# Patient Record
Sex: Male | Born: 1955 | Race: White | Hispanic: No | Marital: Married | Smoking: Former smoker
Health system: Southern US, Community
[De-identification: ages and names within clinical notes are randomized; demographics above are authoritative.]

## PROBLEM LIST (undated history)

## (undated) DIAGNOSIS — K2970 Gastritis, unspecified, without bleeding: Secondary | ICD-10-CM

## (undated) DIAGNOSIS — G629 Polyneuropathy, unspecified: Secondary | ICD-10-CM

## (undated) DIAGNOSIS — I10 Essential (primary) hypertension: Secondary | ICD-10-CM

## (undated) DIAGNOSIS — E669 Obesity, unspecified: Secondary | ICD-10-CM

## (undated) DIAGNOSIS — K449 Diaphragmatic hernia without obstruction or gangrene: Secondary | ICD-10-CM

## (undated) DIAGNOSIS — K219 Gastro-esophageal reflux disease without esophagitis: Secondary | ICD-10-CM

## (undated) DIAGNOSIS — K222 Esophageal obstruction: Secondary | ICD-10-CM

## (undated) DIAGNOSIS — E119 Type 2 diabetes mellitus without complications: Secondary | ICD-10-CM

## (undated) HISTORY — PX: OTHER SURGICAL HISTORY: SHX169

## (undated) HISTORY — PX: INGUINAL HERNIA REPAIR: SUR1180

---

## 2010-09-13 ENCOUNTER — Emergency Department (HOSPITAL_COMMUNITY)
Admission: EM | Admit: 2010-09-13 | Discharge: 2010-09-14 | Payer: Self-pay | Source: Home / Self Care | Admitting: Emergency Medicine

## 2010-11-29 LAB — BASIC METABOLIC PANEL
BUN: 15 mg/dL (ref 6–23)
Calcium: 9.3 mg/dL (ref 8.4–10.5)
Creatinine, Ser: 0.91 mg/dL (ref 0.4–1.5)
GFR calc non Af Amer: >60 mL/min (ref >60)
Glucose, Bld: 124 mg/dL — ABNORMAL HIGH (ref 70–99)
Potassium: 3.8 mEq/L (ref 3.5–5.1)

## 2010-11-29 LAB — URINALYSIS, ROUTINE W REFLEX MICROSCOPIC
Bilirubin Urine: NEGATIVE
Ketones, ur: 15 mg/dL — AB
Nitrite: NEGATIVE
pH: 5.5 (ref 5.0–8.0)

## 2021-08-05 ENCOUNTER — Other Ambulatory Visit (HOSPITAL_COMMUNITY): Payer: Self-pay | Admitting: Family Medicine

## 2021-08-05 ENCOUNTER — Other Ambulatory Visit: Payer: Self-pay | Admitting: Family Medicine

## 2021-08-05 DIAGNOSIS — M47816 Spondylosis without myelopathy or radiculopathy, lumbar region: Secondary | ICD-10-CM | POA: Insufficient documentation

## 2021-08-05 DIAGNOSIS — G629 Polyneuropathy, unspecified: Secondary | ICD-10-CM | POA: Insufficient documentation

## 2021-08-05 DIAGNOSIS — E66812 Obesity, class 2: Secondary | ICD-10-CM | POA: Insufficient documentation

## 2021-08-05 DIAGNOSIS — E291 Testicular hypofunction: Secondary | ICD-10-CM | POA: Insufficient documentation

## 2021-08-05 DIAGNOSIS — F17201 Nicotine dependence, unspecified, in remission: Secondary | ICD-10-CM | POA: Insufficient documentation

## 2021-08-10 ENCOUNTER — Ambulatory Visit (HOSPITAL_COMMUNITY)
Admission: RE | Admit: 2021-08-10 | Discharge: 2021-08-10 | Disposition: A | Discharge status: Home / Self Care | Payer: BC Managed Care – PPO | Source: Ambulatory Visit | Attending: Family Medicine | Admitting: Family Medicine

## 2021-08-10 ENCOUNTER — Other Ambulatory Visit (HOSPITAL_COMMUNITY): Payer: Self-pay | Admitting: Family Medicine

## 2021-08-10 ENCOUNTER — Other Ambulatory Visit: Payer: Self-pay

## 2021-08-10 DIAGNOSIS — M542 Cervicalgia: Secondary | ICD-10-CM | POA: Diagnosis present

## 2021-08-19 DIAGNOSIS — N529 Male erectile dysfunction, unspecified: Secondary | ICD-10-CM | POA: Insufficient documentation

## 2021-09-10 ENCOUNTER — Ambulatory Visit: Payer: BC Managed Care – PPO

## 2021-09-10 ENCOUNTER — Other Ambulatory Visit: Payer: Self-pay

## 2021-09-10 ENCOUNTER — Ambulatory Visit: Payer: BC Managed Care – PPO | Admitting: Orthopedic Surgery

## 2021-09-10 ENCOUNTER — Encounter: Payer: Self-pay | Admitting: Orthopedic Surgery

## 2021-09-10 VITALS — Ht 72.0 in | Wt 280.0 lb

## 2021-09-10 DIAGNOSIS — M25562 Pain in left knee: Secondary | ICD-10-CM

## 2021-09-10 DIAGNOSIS — M1712 Unilateral primary osteoarthritis, left knee: Secondary | ICD-10-CM

## 2021-09-10 DIAGNOSIS — M170 Bilateral primary osteoarthritis of knee: Secondary | ICD-10-CM | POA: Diagnosis not present

## 2021-09-10 DIAGNOSIS — M25561 Pain in right knee: Secondary | ICD-10-CM

## 2021-09-10 DIAGNOSIS — M1711 Unilateral primary osteoarthritis, right knee: Secondary | ICD-10-CM | POA: Diagnosis not present

## 2021-09-10 NOTE — Patient Instructions (Signed)

## 2021-09-10 NOTE — Progress Notes (Signed)
New Patient Visit  Assessment: HERMES WAFER is a 65 y.o. male with the following: 1. Arthritis of left knee; moderate to severe 2. Arthritis of right knee; mild to moderate  Plan: Pain in both knees.  Left is worse than right.  Radiographs of the left knee demonstrates severe arthritis overall, with mild osteophytosis, and complete loss of joint space within the medial compartment.  Radiographs of the right knee appear slightly better.  Near complete loss of joint space within the medial compartment.  Small osteophytes also present.  Discussed multiple treatment options, and he would like to proceed with bilateral steroid injections, as he has had a good response in the past.  Continue with medications as needed.  Stay as active as possible.  Follow-up as needed.  Procedure note injection Left knee joint   Verbal consent was obtained to inject the left knee joint  Timeout was completed to confirm the site of injection.  The skin was prepped with alcohol and ethyl chloride was sprayed at the injection site.  A 21-gauge needle was used to inject 40 mg of Depo-Medrol and 1% lidocaine (3 cc) into the left knee using an anterolateral approach.  There were no complications. A sterile bandage was applied.     Procedure note injection Right knee joint   Verbal consent was obtained to inject the right knee joint  Timeout was completed to confirm the site of injection.  The skin was prepped with alcohol and ethyl chloride was sprayed at the injection site.  A 21-gauge needle was used to inject 40 mg of Depo-Medrol and 1% lidocaine (3 cc) into the left knee using an anterolateral approach.  There were no complications. A sterile bandage was applied.    Follow-up: Return if symptoms worsen or fail to improve.  Subjective:  Chief Complaint  Patient presents with   Knee Pain    Lt > Rt for on and off for over 15 yrs.     History of Present Illness: VASILI Solis is a 65 y.o. male who has  been referred to clinic today by Gypsy Decant, DC for evaluation of bilateral knee pain.  Patient has had pain, primarily in the left knee for the past 10-15 years.  He states he had an injection at least 10 years ago, which provided excellent relief.  He is also had progressively worsening pain in the right knee.  Pain is primarily within the medial compartment.  He also has some pain behind the knee.  He notices some grinding sensations in bilateral knees.  He is not currently taking any medications.  No physical therapy.   Review of Systems: No fevers or chills No numbness or tingling No chest pain No shortness of breath No bowel or bladder dysfunction No GI distress No headaches   Medical History:  History reviewed. No pertinent past medical history.  History reviewed. No pertinent surgical history.  History reviewed. No pertinent family history. Social History   Tobacco Use   Smoking status: Former    Types: Cigarettes   Smokeless tobacco: Never    No Known Allergies  No outpatient medications have been marked as taking for the 09/10/21 encounter (Office Visit) with Mordecai Rasmussen, MD.    Objective: Ht 6' (1.829 m)    Wt 280 lb (127 kg)    BMI 37.97 kg/m   Physical Exam:  General: Alert and oriented. Gait: Slow, steady gait.  Evaluation left knee demonstrates a mild varus alignment overall.  No  effusion is appreciated.  Tenderness to palpation along the medial joint line.  No increased laxity varus valgus stress.  Range of motion is from 3-120 degrees.  Negative Lachman.  Evaluation of the right knee demonstrates overall neutral alignment.  No effusion.  Mild tenderness to palpation along medial joint line.  No increased laxity to varus or valgus stress.  Range of motion is from 3-120 degrees.  Negative Lachman.  Crepitus in bilateral knees with range of motion testing.  IMAGING: I personally ordered and reviewed the following images  X-rays of bilateral  knees were obtained in clinic today.  Varus alignment to the left knee, with mild varus alignment of the right knee.  Complete loss of joint space within the medial compartment of the left knee.  Near complete loss of the joint space within the medial compartment of the right knee.  Osteophytes are present in all 3 compartments in bilateral knees.  Degenerative changes are noted within the patellofemoral compartment, including osteophytes, and loss of joint space.  No acute injuries are noted.  Impression: Moderate to severe left knee arthritis, mild to moderate right knee arthritis.   New Medications:  No orders of the defined types were placed in this encounter.     Mordecai Rasmussen, MD  09/10/2021 11:17 PM

## 2021-09-15 ENCOUNTER — Ambulatory Visit (HOSPITAL_COMMUNITY): Payer: Self-pay

## 2021-09-15 ENCOUNTER — Encounter (HOSPITAL_COMMUNITY): Payer: Self-pay

## 2021-11-03 ENCOUNTER — Other Ambulatory Visit: Payer: Self-pay

## 2021-11-03 ENCOUNTER — Encounter: Payer: Self-pay | Admitting: Orthopedic Surgery

## 2021-11-03 ENCOUNTER — Ambulatory Visit: Payer: BC Managed Care – PPO | Admitting: Orthopedic Surgery

## 2021-11-03 DIAGNOSIS — M1712 Unilateral primary osteoarthritis, left knee: Secondary | ICD-10-CM

## 2021-11-03 MED ORDER — PREDNISONE 10 MG (21) PO TBPK: ORAL_TABLET | ORAL | 0 refills | Status: DC

## 2021-11-03 NOTE — Progress Notes (Signed)
Return patient Visit  Assessment: Keith Solis is a 66 y.o. male with the following: 1. Arthritis of left knee; moderate to severe 2. Arthritis of right knee; mild to moderate  Plan: Acute worsening of left knee pain, associated with arthritis.  This is brought on by twisting injury.  It is too early to consider repeat injections.  He also expressed interest in hyaluronic acid injections.  We could consider injections in 6 weeks if he still having difficulty.  I provided him with a course of prednisone.  Continue with activities as tolerated.  Call with issues.  See him in 6 weeks.   Follow-up: Return in about 6 weeks (around 12/15/2021).  Subjective:  Chief Complaint  Patient presents with   Knee Pain    LT/ DOI 11/02/21 Pt stepped into a hole and twisted it. Painful on medial side and unable to completely bend knee     History of Present Illness: Keith Solis is a 66 y.o. male who returns to clinic today for repeat evaluation of left knee pain.  He had bilateral knee injections for arthritis approximately 6 weeks ago.  He had a good response, until recently.  Yesterday, he stepped in a hole, and twisted his knee.  Since then has had significant pain.  He had difficulty with ambulation.  Mild swelling in his left knee.     Review of Systems: No fevers or chills No numbness or tingling No chest pain No shortness of breath No bowel or bladder dysfunction No GI distress No headaches  Objective: There were no vitals taken for this visit.  Physical Exam:  General: Alert and oriented. Gait: Slow, steady gait.  Evaluation left knee demonstrates a mild varus alignment overall.  No effusion is appreciated.  Tenderness to palpation along the medial joint line.  No increased laxity varus valgus stress.  Range of motion is from 3-120 degrees.  Negative Lachman.  IMAGING: I personally ordered and reviewed the following images  No new imaging obtained today.   New  Medications:  Meds ordered this encounter  Medications   predniSONE (STERAPRED UNI-PAK 21 TAB) 10 MG (21) TBPK tablet    Sig: 10 mg DS 12 as directed    Dispense:  48 tablet    Refill:  0      Mordecai Rasmussen, MD  11/03/2021 10:40 PM

## 2021-11-03 NOTE — Patient Instructions (Signed)
If interested in the gel injections, we will have to verify insurance coverage prior to the next appointment.

## 2021-11-09 ENCOUNTER — Telehealth: Payer: Self-pay | Admitting: Radiology

## 2021-11-09 NOTE — Telephone Encounter (Signed)
On 11/05/21 patient brought a note by the office, handwritten, with a phone number to call to authorize "rooster comb" [phone].  Placed in my inbox on 11/08/21.  Patient has seen Dr Amedeo Kinsman x 2 and now is scheduled with Dr Aline Brochure I believe in error.  I will move to Dr Amedeo Kinsman' schedule.    *Dr Amedeo Kinsman- is it ok to proceed with auth of gel injections and do those on March 30th?

## 2021-12-08 ENCOUNTER — Ambulatory Visit: Payer: BC Managed Care – PPO | Admitting: Orthopedic Surgery

## 2021-12-15 ENCOUNTER — Ambulatory Visit: Payer: BC Managed Care – PPO | Admitting: Orthopedic Surgery

## 2021-12-16 ENCOUNTER — Ambulatory Visit: Payer: BC Managed Care – PPO | Admitting: Orthopedic Surgery

## 2021-12-22 ENCOUNTER — Ambulatory Visit: Payer: BC Managed Care – PPO | Admitting: Orthopedic Surgery

## 2021-12-22 DIAGNOSIS — G4733 Obstructive sleep apnea (adult) (pediatric): Secondary | ICD-10-CM | POA: Insufficient documentation

## 2021-12-22 DIAGNOSIS — J309 Allergic rhinitis, unspecified: Secondary | ICD-10-CM | POA: Insufficient documentation

## 2022-01-12 HISTORY — PX: REPLACEMENT TOTAL KNEE: SUR1224

## 2022-01-17 ENCOUNTER — Other Ambulatory Visit (HOSPITAL_COMMUNITY): Payer: Self-pay | Admitting: Orthopedic Surgery

## 2022-01-17 ENCOUNTER — Ambulatory Visit (HOSPITAL_COMMUNITY)
Admission: RE | Admit: 2022-01-17 | Discharge: 2022-01-17 | Disposition: A | Discharge status: Home / Self Care | Payer: BC Managed Care – PPO | Source: Ambulatory Visit | Attending: Orthopedic Surgery | Admitting: Orthopedic Surgery

## 2022-01-17 DIAGNOSIS — M79605 Pain in left leg: Secondary | ICD-10-CM | POA: Diagnosis not present

## 2022-01-17 DIAGNOSIS — M7989 Other specified soft tissue disorders: Secondary | ICD-10-CM | POA: Insufficient documentation

## 2022-03-15 DIAGNOSIS — R5383 Other fatigue: Secondary | ICD-10-CM | POA: Insufficient documentation

## 2022-03-16 DIAGNOSIS — R42 Dizziness and giddiness: Secondary | ICD-10-CM | POA: Insufficient documentation

## 2022-03-29 ENCOUNTER — Telehealth: Payer: Self-pay | Admitting: Orthopedic Surgery

## 2022-03-29 NOTE — Telephone Encounter (Signed)
noted 

## 2022-03-29 NOTE — Telephone Encounter (Signed)
Keith Solis came in today to let us know his insurance has changed now, he has [employer].  He had [employer] before and he came to [place] one time and found out he was NOT in network with [place] and then went to [place] because we were not in network with his insurance and now since he has changed insurance he wants to come back to [place] since he is now in network with [place].  He said he did receive 1 injection from Dr. French Ana.   I did advise him we will need all the office notes from them and any XRAY, CT or MRI, because since he went to another physician it is considered a 2nd opinion.   He will get them to fax [place] all the information.

## 2022-04-07 ENCOUNTER — Encounter: Payer: Self-pay | Admitting: *Deleted

## 2022-04-15 ENCOUNTER — Emergency Department (HOSPITAL_COMMUNITY): Payer: Medicare PPO

## 2022-04-15 ENCOUNTER — Encounter (HOSPITAL_COMMUNITY): Payer: Self-pay

## 2022-04-15 ENCOUNTER — Other Ambulatory Visit: Payer: Self-pay

## 2022-04-15 ENCOUNTER — Observation Stay (HOSPITAL_BASED_OUTPATIENT_CLINIC_OR_DEPARTMENT_OTHER): Payer: Medicare PPO

## 2022-04-15 ENCOUNTER — Observation Stay (HOSPITAL_COMMUNITY)
Admission: EM | Admit: 2022-04-15 | Discharge: 2022-04-15 | Disposition: A | Discharge status: Home / Self Care | Payer: Medicare PPO | Attending: Internal Medicine | Admitting: Internal Medicine

## 2022-04-15 DIAGNOSIS — R0609 Other forms of dyspnea: Secondary | ICD-10-CM | POA: Diagnosis present

## 2022-04-15 DIAGNOSIS — Z79899 Other long term (current) drug therapy: Secondary | ICD-10-CM | POA: Diagnosis not present

## 2022-04-15 DIAGNOSIS — Z87891 Personal history of nicotine dependence: Secondary | ICD-10-CM | POA: Diagnosis not present

## 2022-04-15 DIAGNOSIS — Z96652 Presence of left artificial knee joint: Secondary | ICD-10-CM | POA: Diagnosis not present

## 2022-04-15 DIAGNOSIS — N179 Acute kidney failure, unspecified: Secondary | ICD-10-CM | POA: Insufficient documentation

## 2022-04-15 DIAGNOSIS — J45909 Unspecified asthma, uncomplicated: Secondary | ICD-10-CM | POA: Diagnosis not present

## 2022-04-15 DIAGNOSIS — I959 Hypotension, unspecified: Secondary | ICD-10-CM | POA: Diagnosis present

## 2022-04-15 DIAGNOSIS — E039 Hypothyroidism, unspecified: Secondary | ICD-10-CM | POA: Insufficient documentation

## 2022-04-15 DIAGNOSIS — R55 Syncope and collapse: Secondary | ICD-10-CM | POA: Diagnosis not present

## 2022-04-15 DIAGNOSIS — R0600 Dyspnea, unspecified: Secondary | ICD-10-CM

## 2022-04-15 DIAGNOSIS — I493 Ventricular premature depolarization: Secondary | ICD-10-CM

## 2022-04-15 DIAGNOSIS — N289 Disorder of kidney and ureter, unspecified: Secondary | ICD-10-CM | POA: Insufficient documentation

## 2022-04-15 DIAGNOSIS — I10 Essential (primary) hypertension: Secondary | ICD-10-CM | POA: Insufficient documentation

## 2022-04-15 DIAGNOSIS — R0689 Other abnormalities of breathing: Secondary | ICD-10-CM

## 2022-04-15 DIAGNOSIS — R079 Chest pain, unspecified: Secondary | ICD-10-CM | POA: Diagnosis not present

## 2022-04-15 HISTORY — DX: Essential (primary) hypertension: I10

## 2022-04-15 HISTORY — DX: Polyneuropathy, unspecified: G62.9

## 2022-04-15 LAB — CBC WITH DIFFERENTIAL/PLATELET
Abs Immature Granulocytes: 0.09 10*3/uL — ABNORMAL HIGH (ref 0.00–0.07)
Basophils Absolute: 0.1 10*3/uL (ref 0.0–0.1)
Basophils Relative: 1 %
Eosinophils Absolute: 0.3 10*3/uL (ref 0.0–0.5)
Eosinophils Relative: 2 %
HCT: 50.6 % (ref 39.0–52.0)
Hemoglobin: 16.6 g/dL (ref 13.0–17.0)
Immature Granulocytes: 1 %
Lymphocytes Relative: 14 %
Lymphs Abs: 1.8 10*3/uL (ref 0.7–4.0)
MCH: 29.6 pg (ref 26.0–34.0)
MCHC: 32.8 g/dL (ref 30.0–36.0)
MCV: 90.4 fL (ref 80.0–100.0)
Monocytes Absolute: 0.8 10*3/uL (ref 0.1–1.0)
Monocytes Relative: 6 %
Neutro Abs: 10 10*3/uL — ABNORMAL HIGH (ref 1.7–7.7)
Neutrophils Relative %: 76 %
Platelets: 257 10*3/uL (ref 150–400)
RBC: 5.6 MIL/uL (ref 4.22–5.81)
RDW: 13.2 % (ref 11.5–15.5)
WBC: 13 10*3/uL — ABNORMAL HIGH (ref 4.0–10.5)
nRBC: 0 % (ref 0.0–0.2)

## 2022-04-15 LAB — URINALYSIS, ROUTINE W REFLEX MICROSCOPIC
Bilirubin Urine: NEGATIVE
Glucose, UA: NEGATIVE mg/dL
Hgb urine dipstick: NEGATIVE
Ketones, ur: NEGATIVE mg/dL
Leukocytes,Ua: NEGATIVE
Nitrite: NEGATIVE
Protein, ur: 30 mg/dL — AB
Specific Gravity, Urine: 1.016 (ref 1.005–1.030)
pH: 5 (ref 5.0–8.0)

## 2022-04-15 LAB — COMPREHENSIVE METABOLIC PANEL
ALT: 17 U/L (ref 0–44)
AST: 16 U/L (ref 15–41)
Albumin: 4.1 g/dL (ref 3.5–5.0)
Alkaline Phosphatase: 54 U/L (ref 38–126)
Anion gap: 7 (ref 5–15)
BUN: 14 mg/dL (ref 8–23)
CO2: 29 mmol/L (ref 22–32)
Calcium: 9.3 mg/dL (ref 8.9–10.3)
Chloride: 105 mmol/L (ref 98–111)
Creatinine, Ser: 1.5 mg/dL — ABNORMAL HIGH (ref 0.61–1.24)
GFR, Estimated: 51 mL/min — ABNORMAL LOW (ref >60)
Glucose, Bld: 123 mg/dL — ABNORMAL HIGH (ref 70–99)
Potassium: 3.5 mmol/L (ref 3.5–5.1)
Sodium: 141 mmol/L (ref 135–145)
Total Bilirubin: 1.1 mg/dL (ref 0.3–1.2)
Total Protein: 7.1 g/dL (ref 6.5–8.1)

## 2022-04-15 LAB — PROTIME-INR
INR: 0.9 (ref 0.8–1.2)
Prothrombin Time: 12.5 seconds (ref 11.4–15.2)

## 2022-04-15 LAB — ECHOCARDIOGRAM COMPLETE
Area-P 1/2: 3.03 cm2
Height: 72 in
S' Lateral: 3.8 cm
Weight: 4160 oz

## 2022-04-15 LAB — TROPONIN I (HIGH SENSITIVITY): Troponin I (High Sensitivity): 11 ng/L (ref <18)

## 2022-04-15 MED ORDER — ASPIRIN 81 MG PO CHEW
324.0000 mg | CHEWABLE_TABLET | Freq: Once | ORAL | Status: AC
  Administered 2022-04-15: 324 mg via ORAL
  Filled 2022-04-15: qty 4

## 2022-04-15 MED ORDER — SODIUM CHLORIDE 0.9 % IV BOLUS
1000.0000 mL | Freq: Once | INTRAVENOUS | Status: AC
  Administered 2022-04-15: 1000 mL via INTRAVENOUS

## 2022-04-15 NOTE — Consult Note (Signed)
Cardiology Consult Note:   Patient ID: Keith Solis MRN: [id number]; DOB: July 15, 1956   Admission date: 04/15/2022  PCP:  Celene Squibb, MD   [facility] Providers Cardiologist: [facility]   Chief Complaint:  near syncope  Patient Profile:   Keith Solis is a 66 y.o. male with hypothyroidism, morbid obesity and HTN, peripheral neuropathy NOS, former tobacco abuse, asthma , erectile dysfunction. who is being seen 04/15/2022 for the evaluation of near syncope.  History of Present Illness:   Keith Solis notes that he is feeling fine now- he wants a cheeseburger.    Was last feeling well 04/14/22. Baseline activity includes being able to do yard work without issue. 04/14/22 was doing yard work, used a Risk manager to clear some shrubs and pressure washed most of his house. Took at [place] for erectile dysfunction with wife.   AM went to finish the rest of his house.  After climbing down the house he had shortness of breath.  Going to get his inhaler had near syncope.  Wife check his BP with amb BP cough: hypotensive to 70s.  Patient denies issues with chest pain; notes his neck pain was because of his neck arthritis and looking up to do the pressure washing.  Has had no chest pain, chest pressure, chest tightness, chest stinging .   No shortness of breath, DOE .  No PND or orthopnea.  No bendopnea, weight gain, leg swelling , or abdominal swelling.  No syncope or near syncope presently .   Notes  no palpitations or funny heart beats event when he has PVCs on telemetry.  Was surprised to hear his creatinine was elevated  Inpatient evaluation because of chest pain.  Patient reports no prior cardiac testing.  In the ED patient received medications 1 L and full dose ASA with improvement in BP as symptoms.  Key labs notable for Creatinine 1.5 (former 0.91 back in 2011).  Normal troponin EKG: SR rate 87 WNL Tele: one 5 beat run of NSVT, frequent PVCs Cardiology called  for evaluation.    Past Medical History:  Diagnosis Date   Hypertension    Neuropathy     History reviewed. No pertinent surgical history.   Medications Prior to Admission: Prior to Admission medications   Medication Sig Start Date End Date Taking? Authorizing Provider  amLODipine (NORVASC) 5 MG tablet Take 5 mg by mouth daily.   Yes [provider]  gabapentin (NEURONTIN) 300 MG capsule Take 300 mg by mouth 3 (three) times daily.   Yes [provider]  levothyroxine (SYNTHROID) 75 MCG tablet Take 75 mcg by mouth daily before breakfast.   Yes [provider]  lisinopril (ZESTRIL) 40 MG tablet Take 40 mg by mouth daily.   Yes [provider]  testosterone cypionate (DEPOTESTOSTERONE CYPIONATE) 200 MG/ML injection SMARTSIG:Milliliter(s) IM 09/02/21  Yes [provider]  predniSONE (STERAPRED UNI-PAK 21 TAB) 10 MG (21) TBPK tablet 10 mg DS 12 as directed Patient not taking: Reported on 04/15/2022 11/03/21   Mordecai Rasmussen, MD     Allergies:   No Known Allergies  Social History:   Social History   Socioeconomic History   Marital status: Married    Spouse name: Not on file   Number of children: Not on file   Years of education: Not on file   Highest education level: Not on file  Occupational History   Not on file  Tobacco Use   Smoking status: Former  Types: Cigarettes   Smokeless tobacco: Never  Substance and Sexual Activity   Alcohol use: Not Currently   Drug use: Never   Sexual activity: Yes  Other Topics Concern   Not on file  Social History Narrative   Not on file   Social Determinants of Health   Financial Resource Strain: Not on file  Food Insecurity: Not on file  Transportation Needs: Not on file  Physical Activity: Not on file  Stress: Not on file  Social Connections: Not on file  Intimate Partner Violence: Not on file    Family History:   Siste and unclear have pulmonary sarcoidosis Father had 3VD and CABG  at age 72, he smoked all his life.  ROS:  Please see the history of present illness.  All other ROS reviewed and negative.     Physical Exam/Data:   Vitals:   04/15/22 1112 04/15/22 1130 04/15/22 1200 04/15/22 1300  BP: 126/76 124/78 130/70 138/83  Pulse: 66 70 62 73  Resp: '14 15 14 13  '$ Temp:      TempSrc:      SpO2: 99% 100% 97% 98%  Weight:      Height:       No intake or output data in the 24 hours ending 04/15/22 1319    04/15/2022    9:32 AM 09/10/2021    8:54 AM  Last 3 Weights  Weight (lbs) 260 lb 280 lb  Weight (kg) 117.935 kg 127.007 kg     Body mass index is 35.26 kg/m.  General:  morbid obesity in no acute distress HEENT: normal Neck: no JVD Vascular: No carotid bruits; Distal pulses 2+ bilaterally   Cardiac:  normal S1, S2; RRR lareglyy ; no murmur  Lungs:  clear to auscultation bilaterally, no wheezing, rhonchi or rales  Abd: soft, nontender, no hepatomegaly  Ext: no edema Musculoskeletal:  No deformities, BUE and BLE strength normal and equal Skin: warm and dry  Neuro:  CNs 2-12 intact, no focal abnormalities noted Psych:  Normal affect    Laboratory Data:  High Sensitivity Troponin:   Recent Labs  Lab 04/15/22 0941  TROPONINIHS 11      Chemistry Recent Labs  Lab 04/15/22 0941  NA 141  K 3.5  CL 105  CO2 29  GLUCOSE 123*  BUN 14  CREATININE 1.50*  CALCIUM 9.3  GFRNONAA 51*  ANIONGAP 7    Recent Labs  Lab 04/15/22 0941  PROT 7.1  ALBUMIN 4.1  AST 16  ALT 17  ALKPHOS 54  BILITOT 1.1   Lipids No results for input(s): "CHOL", "TRIG", "HDL", "LABVLDL", "LDLCALC", "CHOLHDL" in the last 168 hours. Hematology Recent Labs  Lab 04/15/22 0941  WBC 13.0*  RBC 5.60  HGB 16.6  HCT 50.6  MCV 90.4  MCH 29.6  MCHC 32.8  RDW 13.2  PLT 257   Thyroid No results for input(s): "TSH", "FREET4" in the last 168 hours. BNPNo results for input(s): "BNP", "PROBNP" in the last 168 hours.  DDimer No results for input(s): "DDIMER" in the  last 168 hours.   Radiology/Studies:  DG Chest Port 1 View  Result Date: 04/15/2022 CLINICAL DATA:  Chest pain.  Chest pain and shortness of breath. EXAM: PORTABLE CHEST 1 VIEW COMPARISON:  No pertinent prior exams available for comparison. FINDINGS: Heart size within normal limits. No appreciable airspace consolidation. No evidence of pleural effusion or pneumothorax. No acute bony abnormality identified. Degenerative changes of the spine. IMPRESSION: No evidence of active cardiopulmonary  disease. Electronically Signed   By: Kellie Simmering D.O.   On: 04/15/2022 09:50     Assessment and Plan:   Near Syncope With frequent PVCs and one episode of NSVT With HTN With FHX of CAD and sarcoidosis With former tobacco use With new AKI - this could all be from new OTC supplement; we discussed stopping this and that outpatient urology eval for ED would be appropriate - would recommend echocardiogram and has worked to facilitate this - if normal would stop CCB, start metoprolol 12.5 mg PO BID, and outpatient ischemic testing with resolution of AKI - if abnormal would warrant further testing - patient and family have concerns about admission: if the are OBS they are fine, but based on their insurance if they are admitted will need to pay $150 per day - we will work to move as much of his work up to outpatient as safely possible - AKI per primary   For questions or updates, please contact [place] Please consult [email or web address] for contact info under     Signed, Werner Lean, MD  04/15/2022 1:19 PM

## 2022-04-15 NOTE — ED Notes (Signed)
Cards at bedside

## 2022-04-15 NOTE — ED Provider Notes (Addendum)
[facility] EMERGENCY DEPARTMENT Provider Note   CSN: [id number] Arrival date & time: 04/15/22  2440     History  Chief Complaint  Patient presents with   Hypotension    Keith Solis is a 66 y.o. male.  HPI  This patient is a 66 year old male with a history of hypertension on amlodipine and lisinopril, he also has hypothyroidism and peripheral neuropathy of unknown cause.  The patient presents today with a feeling of near syncope, he states that he was outside on a ladder working on pressure washing his house but had some progressive shortness of breath.  He does note over the last couple of days he has had a vague discomfort in his chest which he attributed to acid reflux though he states it is not burning and he is not sure what acid reflux feels like.  This morning he also has a discomfort in the back of his neck and his shoulders which he usually does not have.  He has not passed out, he felt like he was going to, he checked his blood pressure and it was in the 60s, it gradually increased to the 80s, he decided to come to the hospital because he was feeling very poorly.  On arrival the patient's blood pressure is 105/63, he still feels a vague shortness of breath, he denies any leg swelling.  He does report a prior history of a left knee arthroplasty, he did require an EKG prior to that for screening but is never had a stress test or known coronary disease.  He does report that he stopped smoking about 10 years ago, he does not have diabetes nor does he have high cholesterol.  Home Medications Prior to Admission medications   Medication Sig Start Date End Date Taking? Authorizing Provider  amLODipine (NORVASC) 5 MG tablet Take 5 mg by mouth daily.   Yes [provider]  gabapentin (NEURONTIN) 300 MG capsule Take 300 mg by mouth 3 (three) times daily.   Yes [provider]  levothyroxine (SYNTHROID) 75 MCG tablet Take 75 mcg by mouth daily before breakfast.   Yes  [provider]  lisinopril (ZESTRIL) 40 MG tablet Take 40 mg by mouth daily.   Yes [provider]  testosterone cypionate (DEPOTESTOSTERONE CYPIONATE) 200 MG/ML injection SMARTSIG:Milliliter(s) IM 09/02/21  Yes [provider]  predniSONE (STERAPRED UNI-PAK 21 TAB) 10 MG (21) TBPK tablet 10 mg DS 12 as directed Patient not taking: Reported on 04/15/2022 11/03/21   Mordecai Rasmussen, MD      Allergies    Patient has no known allergies.    Review of Systems   Review of Systems  All other systems reviewed and are negative.   Physical Exam Updated Vital Signs BP (!) 142/64   Pulse 76   Temp 97.8 F (36.6 C) (Oral)   Resp 17   Ht 1.829 m (6')   Wt 117.9 kg   SpO2 96%   BMI 35.26 kg/m  Physical Exam Vitals and nursing note reviewed.  Constitutional:      General: He is not in acute distress.    Appearance: He is well-developed.  HENT:     Head: Normocephalic and atraumatic.     Mouth/Throat:     Pharynx: No oropharyngeal exudate.  Eyes:     General: No scleral icterus.       Right eye: No discharge.        Left eye: No discharge.     Conjunctiva/sclera: Conjunctivae  normal.     Pupils: Pupils are equal, round, and reactive to light.  Neck:     Thyroid: No thyromegaly.     Vascular: No JVD.  Cardiovascular:     Rate and Rhythm: Normal rate and regular rhythm.     Heart sounds: Normal heart sounds. No murmur heard.    No friction rub. No gallop.  Pulmonary:     Effort: Pulmonary effort is normal. No respiratory distress.     Breath sounds: Normal breath sounds. No wheezing or rales.  Abdominal:     General: Bowel sounds are normal. There is no distension.     Palpations: Abdomen is soft. There is no mass.     Tenderness: There is no abdominal tenderness.  Musculoskeletal:        General: No tenderness. Normal range of motion.     Cervical back: Normal range of motion and neck supple.     Right lower leg: No edema.     Left lower leg: No  edema.  Lymphadenopathy:     Cervical: No cervical adenopathy.  Skin:    General: Skin is warm and dry.     Findings: No erythema or rash.  Neurological:     Mental Status: He is alert.     Coordination: Coordination normal.  Psychiatric:        Behavior: Behavior normal.     ED Results / Procedures / Treatments   Labs (all labs ordered are listed, but only abnormal results are displayed) Labs Reviewed  CBC WITH DIFFERENTIAL/PLATELET - Abnormal; Notable for the following components:      Result Value   WBC 13.0 (*)    Neutro Abs 10.0 (*)    Abs Immature Granulocytes 0.09 (*)    All other components within normal limits  COMPREHENSIVE METABOLIC PANEL - Abnormal; Notable for the following components:   Glucose, Bld 123 (*)    Creatinine, Ser 1.50 (*)    GFR, Estimated 51 (*)    All other components within normal limits  URINALYSIS, ROUTINE W REFLEX MICROSCOPIC - Abnormal; Notable for the following components:   APPearance HAZY (*)    Protein, ur 30 (*)    Bacteria, UA RARE (*)    All other components within normal limits  PROTIME-INR  TROPONIN I (HIGH SENSITIVITY)    EKG EKG Interpretation  Date/Time:  Friday April 15 2022 09:45:24 EDT Ventricular Rate:  87 PR Interval:  153 QRS Duration: 98 QT Interval:  340 QTC Calculation: 409 R Axis:   67 Text Interpretation: Sinus rhythm Normal ECG Confirmed by Noemi Chapel [phone]) on 04/15/2022 10:02:54 AM  Radiology DG Chest Port 1 View  Result Date: 04/15/2022 CLINICAL DATA:  Chest pain.  Chest pain and shortness of breath. EXAM: PORTABLE CHEST 1 VIEW COMPARISON:  No pertinent prior exams available for comparison. FINDINGS: Heart size within normal limits. No appreciable airspace consolidation. No evidence of pleural effusion or pneumothorax. No acute bony abnormality identified. Degenerative changes of the spine. IMPRESSION: No evidence of active cardiopulmonary disease. Electronically Signed   By: Kellie Simmering D.O.   On:  04/15/2022 09:50    Procedures Procedures    Medications Ordered in ED Medications  sodium chloride 0.9 % bolus 1,000 mL (0 mLs Intravenous Stopped 04/15/22 1230)  aspirin chewable tablet 324 mg (324 mg Oral Given 04/15/22 7408)    ED Course/ Medical Decision Making/ A&P  Medical Decision Making Amount and/or Complexity of Data Reviewed Labs: ordered. Radiology: ordered. ECG/medicine tests: ordered.  Risk OTC drugs. Decision regarding hospitalization.   This patient presents to the ED for concern of near syncope, hypotension and some chest discomfort, this involves an extensive number of treatment options, and is a complaint that carries with it a high risk of complications and morbidity.  The differential diagnosis includes acute coronary syndrome, dehydration, orthostasis.  Of note the patient has not had his morning blood pressure medication raising more suspicion of an alternative cause given that he was fairly hypotensive.  He did not have anything to eat but did have some coffee.   Co morbidities that complicate the patient evaluation  Hypertension Prior tobacco use Obesity   Additional history obtained:  Additional history obtained from spouse at the bedside as well as the electronic medical record External records from outside source obtained and reviewed including followed by orthopedics and family doctor in the office, no cardiology evaluation   Lab Tests:  I Ordered, and personally interpreted labs.  The pertinent results include: Renal insufficiency, CBC shows no significant anemia, there is a leukocytosis, troponin is negative   Imaging Studies ordered:  I ordered imaging studies including chest x-ray I independently visualized and interpreted imaging which showed no acute findings I agree with the radiologist interpretation   Cardiac Monitoring: / EKG:  The patient was maintained on a cardiac monitor.  I personally viewed  and interpreted the cardiac monitored which showed an underlying rhythm of: Normal sinus rhythm   Consultations Obtained:  I requested consultation with the hospitalist Dr. Manuella Ghazi,  and discussed lab and imaging findings as well as pertinent plan - they recommend: Admission to the hospital   Problem List / ED Course / Critical interventions / Medication management  Patient with what could be a coronary equivalent needs to come in for provocative testing and further evaluation I ordered medication including IV fluid for hypotension Reevaluation of the patient after these medicines showed that the patient improved I have reviewed the patients home medicines and have made adjustments as needed   Social Determinants of Health:  None   Test / Admission - Considered:  Needs to be admitted to the hospital   Patient was hesitant about getting an echocardiogram secondary to insurance reimbursement.  I spoke with Kennan by phone with a representative "Hewitt Blade", he states that this would be completely covered as long as he was in the emergency department.  The patient is now agreeable to proceed with this.  We will contact echocardiogram department   The patient is now agreeable as I have talked to his insurance company, he will get the echocardiogram.  Cardiology has seen the patient, they will weigh in after the echo is done.  The patient is willing to stay till the end of that time.  At the time of change of shift care signed out to Dr. Roderic Palau to follow-up recommendations and disposition accordingly       Final Clinical Impression(s) / ED Diagnoses Final diagnoses:  Near syncope  Renal insufficiency     Noemi Chapel, MD 04/15/22 1144    Noemi Chapel, MD 04/15/22 1531

## 2022-04-15 NOTE — Progress Notes (Signed)
*  PRELIMINARY RESULTS* Echocardiogram 2D Echocardiogram has been performed.  Keith Solis 04/15/2022, 4:46 PM

## 2022-04-15 NOTE — ED Notes (Signed)
Hospitalist at bedside 

## 2022-04-15 NOTE — ED Notes (Signed)
Pt d/c home per MD order. Discharge summary reviewed, pt verbalizes understanding. Ambulatory off unit with wife. No s/s of acute distress noted at discharge,

## 2022-04-15 NOTE — ED Notes (Signed)
Pt refusing ECHO at this time. Dr Sabra Heck notified.

## 2022-04-15 NOTE — ED Notes (Signed)
ECHO at bedside.

## 2022-04-15 NOTE — ED Triage Notes (Signed)
Patient states that he was working outside this am for two hours and developed SHOB and dizziness. States that he went inside and checked BP and SBP was in the 60's. Gradually increased before coming to ED. States that he had similar episode one month ago. Takes HTN meds, none this am. States that he had neck and shoulder pain this which is abnormal.

## 2022-04-15 NOTE — ED Notes (Signed)
Dr Sabra Heck to bedside to talk with pt, Pt wife reports calling insurance company.  pt now reports he wants ECHO. ECHO Tech notified.

## 2022-04-15 NOTE — Progress Notes (Signed)
Patient seen and evaluated at bedside to consider for admission regarding near syncope, likely of cardiogenic origin.  Patient is reluctant to be admitted to the hospital and would like cardiology assessment to determine whether or not he truly requires admission for stress testing.  2D echocardiogram has been ordered by cardiology with results currently pending which will help determine disposition.  ED provider notified.  Total care time: 25 minutes.

## 2022-04-15 NOTE — Discharge Instructions (Signed)
Follow-up with the cardiologist as planned.  You have an appointment August 10

## 2022-04-15 NOTE — ED Notes (Addendum)
This RN notified by Melvyn Neth PA that pt was cleared from cardiology standpoint at that Attending MD had been notified for discharge.

## 2022-04-15 NOTE — ED Notes (Signed)
MD in room at time of triage for MSE. 

## 2022-04-15 NOTE — ED Notes (Signed)
This RN notified EDP Zammit of echo completion and message for cardiology

## 2022-04-20 ENCOUNTER — Encounter: Payer: Self-pay | Admitting: *Deleted

## 2022-04-20 NOTE — Patient Instructions (Signed)
  Procedure: colonoscopy    Estimated body mass index is 35.26 kg/m as calculated from the following:   Height as of this encounter: 6' (1.829 m).   Weight as of this encounter: 260 lb (117.9 kg).    Have you had a colonoscopy before?  Yes, [place], [place], 2019, (in care everywhere)  Do you have family history of colon cancer  No  Do you have a family history of polyps? Yes  Previous colonoscopy with polyps removed? Yes  Do you have a history colorectal cancer?   No  Are you diabetic?  No  Do you have a prosthetic or mechanical heart valve? No  Do you have a pacemaker/defibrillator?   No  Have you had endocarditis/atrial fibrillation?  No  Do you use supplemental oxygen/CPAP?  No  Have you had joint replacement within the last 12 months?  Yes, 01/12/22  Do you tend to be constipated or have to use laxatives?  occasionally   Do you have history of alcohol use? If yes, how much and how often.  No, not since 2018   Do you have history or are you using drugs? If yes, what do are you  using?  No  Have you ever had a stroke/heart attack?  No  Have you ever had a heart or other vascular stent placed,?No  Do you take weight loss medication? No  Do you take any blood-thinning medications such as: (Plavix, aspirin, Coumadin, Aggrenox, Brilinta, Xarelto, Eliquis, Pradaxa, Savaysa or Effient) No  If yes we need the name, milligram, dosage and who is prescribing doctor:  N/A             Current Outpatient Medications  Medication Sig Dispense Refill   amLODipine (NORVASC) 5 MG tablet Take 5 mg by mouth daily.     gabapentin (NEURONTIN) 300 MG capsule Take 300 mg by mouth 3 (three) times daily.     levothyroxine (SYNTHROID) 75 MCG tablet Take 75 mcg by mouth daily before breakfast.     lisinopril (ZESTRIL) 40 MG tablet Take 40 mg by mouth daily.     No current facility-administered medications for this visit.    No Known Allergies

## 2022-04-28 ENCOUNTER — Encounter: Payer: Self-pay | Admitting: Internal Medicine

## 2022-04-28 ENCOUNTER — Ambulatory Visit: Payer: Medicare PPO | Admitting: Internal Medicine

## 2022-04-28 VITALS — BP 132/68 | HR 76 | Ht 72.0 in | Wt 272.0 lb

## 2022-04-28 DIAGNOSIS — I10 Essential (primary) hypertension: Secondary | ICD-10-CM | POA: Diagnosis not present

## 2022-04-28 DIAGNOSIS — I77810 Thoracic aortic ectasia: Secondary | ICD-10-CM | POA: Insufficient documentation

## 2022-04-28 DIAGNOSIS — I493 Ventricular premature depolarization: Secondary | ICD-10-CM

## 2022-04-28 NOTE — Patient Instructions (Signed)
Medication Instructions:  Your physician recommends that you continue on your current medications as directed. Please refer to the Current Medication list given to you today.  *If you need a refill on your cardiac medications before your next appointment, please call your pharmacy*   Lab Work: NONE If you have labs (blood work) drawn today and your tests are completely normal, you will receive your results only by: [place] (if you have MyChart) OR A paper copy in the mail If you have any lab test that is abnormal or we need to change your treatment, we will call you to review the results.   Testing/Procedures: July 2024 in [place]: Your physician has requested that you have an echocardiogram. Echocardiography is a painless test that uses sound waves to create images of your heart. It provides your doctor with information about the size and shape of your heart and how well your heart's chambers and valves are working. This procedure takes approximately one hour. There are no restrictions for this procedure.    Follow-Up: At [facility], you and your health needs are our priority.  As part of our continuing mission to provide you with exceptional heart care, we have created designated Provider Care Teams.  These Care Teams include your primary Cardiologist (physician) and Advanced Practice Providers (APPs -  Physician Assistants and Nurse Practitioners) who all work together to provide you with the care you need, when you need it.  We recommend signing up for the patient portal called "MyChart".  Sign up information is provided on this After Visit Summary.  MyChart is used to connect with patients for Virtual Visits (Telemedicine).  Patients are able to view lab/test results, encounter notes, upcoming appointments, etc.  Non-urgent messages can be sent to your provider as well.   To learn more about what you can do with MyChart, go to [email or web address].    Your next  appointment:   1 year(s)  The format for your next appointment:   In Person  Provider:   You may see MD or one of the following Advanced Practice Providers on your designated Care Team:   [place], PA-C  Ermalinda Barrios, PA-C {  Important Information About Sugar

## 2022-04-28 NOTE — Progress Notes (Signed)
Cardiology Office Note:    Date:  04/28/2022   ID:  ABUBAKAR CRISPO, DOB 1956-07-25, MRN [id number]  PCP:  Celene Squibb, MD   [facility] Providers Cardiologist:  None     Referring MD: Celene Squibb, MD   CC: Follow up one year  History of Present Illness:    ARY LAVINE is a 66 y.o. male with a hx of HTN and prior orthostatic hypotension who presents for evaluation.  Patient notes that he is feeling much better.  Since his episode prior to ED visit (pressure washed his house, took OTC ED supplement, woke up in AM, and near syncope and hypotension)  he has taken normal supplements andsx have resolves.  Has had no chest pain, chest pressure, chest tightness, chest stinging.  No shortness of breath, DOE .  No PND or orthopnea.  No weight gain, leg swelling , or abdominal swelling.  No syncope or near syncope . Notes  no palpitations or funny heart beats.   Didn't know he was having PVCs when I same him.   Past Medical History:  Diagnosis Date   Hypertension    Neuropathy     No past surgical history on file.  Current Medications: Current Meds  Medication Sig   amLODipine (NORVASC) 5 MG tablet Take 5 mg by mouth daily.   gabapentin (NEURONTIN) 300 MG capsule Take 300 mg by mouth 3 (three) times daily.   levothyroxine (SYNTHROID) 75 MCG tablet Take 75 mcg by mouth daily before breakfast.   lisinopril (ZESTRIL) 40 MG tablet Take 40 mg by mouth daily.   testosterone cypionate (DEPOTESTOTERONE CYPIONATE) 100 MG/ML injection Inject into the muscle every 14 (fourteen) days. For IM use only     Allergies:   Patient has no known allergies.   Social History   Socioeconomic History   Marital status: Married    Spouse name: Not on file   Number of children: Not on file   Years of education: Not on file   Highest education level: Not on file  Occupational History   Not on file  Tobacco Use   Smoking status: Former    Types: Cigarettes   Smokeless tobacco:  Never  Substance and Sexual Activity   Alcohol use: Not Currently   Drug use: Never   Sexual activity: Yes  Other Topics Concern   Not on file  Social History Narrative   Not on file   Social Determinants of Health   Financial Resource Strain: Not on file  Food Insecurity: Not on file  Transportation Needs: Not on file  Physical Activity: Not on file  Stress: Not on file  Social Connections: Not on file     Family History: History of coronary artery disease notable for no members. History of heart failure notable for no members. History of arrhythmia notable for no members. Denies family history of sudden cardiac death including drowning, car accidents, or unexplained deaths in the family no members No history of bicuspid aortic valve or aortic aneurysm or dissection.  (Patient's wife's father had TAA) No history of cardiomyopathies including hypertrophic cardiomyopathy, left ventricular non-compaction, or arrhythmogenic right ventricular cardiomyopathy.  ROS:   Please see the history of present illness.     All other systems reviewed and are negative.  EKGs/Labs/Other Studies Reviewed:    The following studies were reviewed today:  EKG:  EKG is  ordered today.  The ekg ordered today demonstrates  04/18/22 SR rate 87  ECHO  COMPLETE WO IMAGING ENHANCING AGENT 04/15/2022  Narrative ECHOCARDIOGRAM REPORT    Patient Name:   NENO HOHENSEE Date of Exam: 04/15/2022 Medical Rec #:  [id number]       Height:       72.0 in Accession #:    [id number]      Weight:       260.0 lb Date of Birth:  1956/08/31       BSA:          2.382 m Patient Age:    83 years        BP:           130/70 mmHg Patient Gender: M               HR:           62 bpm. Exam Location:  [facility]  Procedure: 2D Echo, Cardiac Doppler and Color Doppler  MODIFIED REPORT: This report was modified by Rudean Haskell MD on 04/15/2022 due to New images added. Indications:     Chest Pain  R07.9  History:         Patient has no prior history of Echocardiogram examinations. Risk Factors:Hypertension.  Sonographer:     Alvino Chapel RCS Referring Phys:  [id number] [place] Diagnosing Phys: Rudean Haskell MD  IMPRESSIONS   1. Left ventricular ejection fraction, by estimation, is 55 to 60%. The left ventricle has normal function. Left ventricular endocardial border not optimally defined to evaluate regional wall motion, though no seen in views obtained (cannot see anterolated myocardial borders well). There is mild concentric left ventricular hypertrophy. Left ventricular diastolic parameters are indeterminate. 2. Right ventricular systolic function is normal. The right ventricular size is normal. Tricuspid regurgitation signal is inadequate for assessing PA pressure. 3. Left atrial size was moderately dilated. 4. The mitral valve is normal in structure. No evidence of mitral valve regurgitation. No evidence of mitral stenosis. 5. The aortic valve is tricuspid. Aortic valve regurgitation is not visualized. No aortic stenosis is present. 6. Aortic dilatation noted. There is mild dilatation of the aortic root, measuring 40 mm. 7. The inferior vena cava is normal in size with greater than 50% respiratory variability, suggesting right atrial pressure of 3 mmHg.    Recent Labs: 04/15/2022: ALT 17; BUN 14; Creatinine, Ser 1.50; Hemoglobin 16.6; Platelets 257; Potassium 3.5; Sodium 141  Recent Lipid Panel No results found for: "CHOL", "TRIG", "HDL", "CHOLHDL", "VLDL", "LDLCALC", "LDLDIRECT"      Physical Exam:    VS:  BP 132/68   Pulse 76   Ht 6' (1.829 m)   Wt 272 lb (123.4 kg)   SpO2 96%   BMI 36.89 kg/m     Orthostatic Vitals: Supine:  BP 122/72  HR 69 Sitting:  BP 112/68  HR 78 Standing:  BP 116/73  HR 85 Prolonged Stand:  BP 117/73 HR 84   Wt Readings from Last 3 Encounters:  04/28/22 272 lb (123.4 kg)  04/20/22 260 lb (117.9 kg)  04/15/22 260 lb  (117.9 kg)     GEN:  Well nourished, well developed in no acute distress HEENT: Normal NECK: No JVD; No carotid bruits LYMPHATICS: No lymphadenopathy CARDIAC: RRR, no murmurs, rubs, gallops RESPIRATORY:  Clear to auscultation without rales, wheezing or rhonchi  ABDOMEN: Soft, non-tender, non-distended MUSCULOSKELETAL:  Trivial edema lett legNo deformity  SKIN: Warm and dry NEUROLOGIC:  Alert and oriented x 3 PSYCHIATRIC:  Normal affect   ASSESSMENT:    1. Aortic  root dilation ([place])   2. Essential hypertension   3. PVC's (premature ventricular contractions)    PLAN:    Near syncope HTN ED - related to OTC supplement and anti-hypertensives - in the future, if having ED, would need urology referral and may recommend against PDE5i  Mild aortic dilation - may be normal for height and BSA, repeat echo one year  PVCs - no further sx, offered heart monitor; will place if symplasmatic   One year f/u         Medication Adjustments/Labs and Tests Ordered: Current medicines are reviewed at length with the patient today.  Concerns regarding medicines are outlined above.  Orders Placed This Encounter  Procedures   ECHOCARDIOGRAM COMPLETE   No orders of the defined types were placed in this encounter.   Patient Instructions  Medication Instructions:  Your physician recommends that you continue on your current medications as directed. Please refer to the Current Medication list given to you today.  *If you need a refill on your cardiac medications before your next appointment, please call your pharmacy*   Lab Work: NONE If you have labs (blood work) drawn today and your tests are completely normal, you will receive your results only by: [place] (if you have MyChart) OR A paper copy in the mail If you have any lab test that is abnormal or we need to change your treatment, we will call you to review the results.   Testing/Procedures: July 2024 in [place]:  Your physician has requested that you have an echocardiogram. Echocardiography is a painless test that uses sound waves to create images of your heart. It provides your doctor with information about the size and shape of your heart and how well your heart's chambers and valves are working. This procedure takes approximately one hour. There are no restrictions for this procedure.    Follow-Up: At [facility], you and your health needs are our priority.  As part of our continuing mission to provide you with exceptional heart care, we have created designated Provider Care Teams.  These Care Teams include your primary Cardiologist (physician) and Advanced Practice Providers (APPs -  Physician Assistants and Nurse Practitioners) who all work together to provide you with the care you need, when you need it.  We recommend signing up for the patient portal called "MyChart".  Sign up information is provided on this After Visit Summary.  MyChart is used to connect with patients for Virtual Visits (Telemedicine).  Patients are able to view lab/test results, encounter notes, upcoming appointments, etc.  Non-urgent messages can be sent to your provider as well.   To learn more about what you can do with MyChart, go to [email or web address].    Your next appointment:   1 year(s)  The format for your next appointment:   In Person  Provider:   You may see MD or one of the following Advanced Practice Providers on your designated Care Team:   Bernerd Pho, PA-C  Ermalinda Barrios, [place] {  Important Information About Sugar         Signed, Werner Lean, MD  04/28/2022 10:00 AM    [place]

## 2022-05-02 NOTE — Progress Notes (Signed)
Please schedule. thanks

## 2022-05-04 NOTE — Progress Notes (Signed)
OV made °

## 2022-05-29 NOTE — Progress Notes (Unsigned)
GI Office Note    Referring Provider: Celene Squibb, MD Primary Care Physician:  Celene Squibb, MD  Primary Gastroenterologist: Dr. Abbey Chatters  Chief Complaint   No chief complaint on file.    History of Present Illness   Keith Solis is a 66 y.o. male presenting today at the request of [place] Crane, Edwinna Areola, MD for ***colonoscopy.  Last colonoscopy February 2019 reviewed in care everywhere -performed by Dr. Hampton Abbot with [facility].  Op note unavailable for review.  Per pathology report patient has tolerated cecal polyp, hyperplastic ascending colon polyp, and tubular adenoma in the descending colon.  Recent ED visit for syncope in late July 2023.  He reportedly was doing yard work the day prior to his syncope and the morning after he had shortness of breath while attempting to finish it and had syncopal episode after reaching for his inhaler.  He reportedly was hypotensive to the systolic 21Y.  He denied any chest pain, chest tightness, chest pressure, dyspnea on exertion, orthopnea, edema, palpitations, or prior syncopal episodes.  He did report he had taken over-the-counter supplement for ED.  He received fluids and full dose aspirin with improvement in his blood pressure.  He reported a creatinine of 1.5 and normal troponins.  He had EKG with normal sinus rhythm.  While in telemetry he had a 5 beat run of NSVT and frequent PVCs.  Cardiology consulted who suspected that this was likely due to his new over-the-counter supplement and recommended echocardiogram.  (If abnormal echocardiogram, stop calcium channel blocker and start metoprolol 12.5 mg twice daily and outpatient ischemic testing after resolution of AKI).   Echo 04/15/2022 with LVEF 55 to 60%, no valvular abnormalities.  Aortic dilatation noted, measuring 40 mm.  Patient had follow-up with cardiology.  They advise repeat echo in 1 year.  Patient has denied any cardiovascular symptoms and no more syncope or hypotension.  He reported he  stopped his over-the-counter supplement.  He was advised to consult with urology if needing medication for ED.  Advised follow-up in 1 year.  Today:     Current Outpatient Medications  Medication Sig Dispense Refill   amLODipine (NORVASC) 5 MG tablet Take 5 mg by mouth daily.     gabapentin (NEURONTIN) 300 MG capsule Take 300 mg by mouth 3 (three) times daily.     levothyroxine (SYNTHROID) 75 MCG tablet Take 75 mcg by mouth daily before breakfast.     lisinopril (ZESTRIL) 40 MG tablet Take 40 mg by mouth daily.     testosterone cypionate (DEPOTESTOTERONE CYPIONATE) 100 MG/ML injection Inject into the muscle every 14 (fourteen) days. For IM use only     No current facility-administered medications for this visit.    Past Medical History:  Diagnosis Date   Hypertension    Neuropathy     No past surgical history on file.  No family history on file.  Allergies as of 05/30/2022   (No Known Allergies)    Social History   Socioeconomic History   Marital status: Married    Spouse name: Not on file   Number of children: Not on file   Years of education: Not on file   Highest education level: Not on file  Occupational History   Not on file  Tobacco Use   Smoking status: Former    Types: Cigarettes   Smokeless tobacco: Never  Substance and Sexual Activity   Alcohol use: Not Currently   Drug use: Never   Sexual  activity: Yes  Other Topics Concern   Not on file  Social History Narrative   Not on file   Social Determinants of Health   Financial Resource Strain: Not on file  Food Insecurity: Not on file  Transportation Needs: Not on file  Physical Activity: Not on file  Stress: Not on file  Social Connections: Not on file  Intimate Partner Violence: Not on file     Review of Systems   Gen: Denies any fever, chills, fatigue, weight loss, lack of appetite.  CV: Denies chest pain, heart palpitations, peripheral edema, syncope.  Resp: Denies shortness of breath at  rest or with exertion. Denies wheezing or cough.  GI: see HPI GU : Denies urinary burning, urinary frequency, urinary hesitancy MS: Denies joint pain, muscle weakness, cramps, or limitation of movement.  Derm: Denies rash, itching, dry skin Psych: Denies depression, anxiety, memory loss, and confusion Heme: Denies bruising, bleeding, and enlarged lymph nodes.   Physical Exam   There were no vitals taken for this visit.  General:   Alert and oriented. Pleasant and cooperative. Well-nourished and well-developed.  Head:  Normocephalic and atraumatic. Eyes:  Without icterus, sclera clear and conjunctiva pink.  Ears:  Normal auditory acuity. Mouth:  No deformity or lesions, oral mucosa pink.  Lungs:  Clear to auscultation bilaterally. No wheezes, rales, or rhonchi. No distress.  Heart:  S1, S2 present without murmurs appreciated.  Abdomen:  +BS, soft, non-tender and non-distended. No HSM noted. No guarding or rebound. No masses appreciated.  Rectal:  Deferred  Msk:  Symmetrical without gross deformities. Normal posture. Extremities:  Without edema. Neurologic:  Alert and  oriented x4;  grossly normal neurologically. Skin:  Intact without significant lesions or rashes. Psych:  Alert and cooperative. Normal mood and affect.   Assessment   Keith Solis is a 66 y.o. male with a history of hypertension with prior orthostatic hypotension aortic dilatation*** presenting today with for evaluation prior to scheduling colonoscopy  History of adenomatous colon polyps: Last colonoscopy in 2019, I will report unavailable.  Pathology revealing serrated polyp and a hyperplastic polyp, and tubular adenoma.   PLAN   *** Request prior colonoscopy operative report from [facility] Proceed with colonoscopy with propofol by Dr. Abbey Chatters  in near future: the risks, benefits, and alternatives have been discussed with the patient in detail. The patient states understanding and desires to proceed.  ASA  2    Venetia Night, MSN, FNP-BC, AGACNP-BC [facility]

## 2022-05-30 ENCOUNTER — Ambulatory Visit (INDEPENDENT_AMBULATORY_CARE_PROVIDER_SITE_OTHER): Payer: Medicare PPO | Admitting: Gastroenterology

## 2022-05-30 ENCOUNTER — Encounter: Payer: Self-pay | Admitting: *Deleted

## 2022-05-30 ENCOUNTER — Telehealth: Payer: Self-pay | Admitting: *Deleted

## 2022-05-30 ENCOUNTER — Encounter: Payer: Self-pay | Admitting: Gastroenterology

## 2022-05-30 VITALS — BP 122/71 | HR 70 | Temp 98.1°F | Ht 72.0 in | Wt 277.4 lb

## 2022-05-30 DIAGNOSIS — M546 Pain in thoracic spine: Secondary | ICD-10-CM | POA: Diagnosis not present

## 2022-05-30 DIAGNOSIS — Z86010 Personal history of colonic polyps: Secondary | ICD-10-CM | POA: Diagnosis not present

## 2022-05-30 DIAGNOSIS — K219 Gastro-esophageal reflux disease without esophagitis: Secondary | ICD-10-CM

## 2022-05-30 DIAGNOSIS — M9903 Segmental and somatic dysfunction of lumbar region: Secondary | ICD-10-CM | POA: Diagnosis not present

## 2022-05-30 DIAGNOSIS — M9901 Segmental and somatic dysfunction of cervical region: Secondary | ICD-10-CM | POA: Diagnosis not present

## 2022-05-30 DIAGNOSIS — R1310 Dysphagia, unspecified: Secondary | ICD-10-CM

## 2022-05-30 DIAGNOSIS — M542 Cervicalgia: Secondary | ICD-10-CM | POA: Diagnosis not present

## 2022-05-30 DIAGNOSIS — M9902 Segmental and somatic dysfunction of thoracic region: Secondary | ICD-10-CM | POA: Diagnosis not present

## 2022-05-30 MED ORDER — NA SULFATE-K SULFATE-MG SULF 17.5-3.13-1.6 GM/177ML PO SOLN: ORAL | 0 refills | Status: DC

## 2022-05-30 NOTE — Patient Instructions (Addendum)
We are scheduling you for a colonoscopy and upper endoscopy with Dr. Abbey Chatters.   Take famotidine as needed for reflux symptoms. Avoid trigger foods.  Follow a GERD diet:  Avoid fried, fatty, greasy, spicy, citrus foods. Avoid caffeine and carbonated beverages. Avoid chocolate. Try eating 4-6 small meals a day rather than 3 large meals. Do not eat within 3 hours of laying down. Prop head of bed up on wood or bricks to create a 6 inch incline.  I will see you for follow-up 2 months after your procedure.  Please not hesitate to contact the office you have any issues in the meantime.  It was a pleasure to see you today. I want to create trusting relationships with patients. If you receive a survey regarding your visit,  I greatly appreciate you taking time to fill this out on paper or through your MyChart. I value your feedback.  Venetia Night, MSN, FNP-BC, AGACNP-BC [facility]

## 2022-05-30 NOTE — Telephone Encounter (Signed)
PA submitted via cohere. Authorization #[id number], DOS: 06/23/2022 - 08/23/2022

## 2022-06-09 DIAGNOSIS — R891 Abnormal level of hormones in specimens from other organs, systems and tissues: Secondary | ICD-10-CM | POA: Diagnosis not present

## 2022-06-14 DIAGNOSIS — Z09 Encounter for follow-up examination after completed treatment for conditions other than malignant neoplasm: Secondary | ICD-10-CM | POA: Diagnosis not present

## 2022-06-14 DIAGNOSIS — Z96652 Presence of left artificial knee joint: Secondary | ICD-10-CM | POA: Diagnosis not present

## 2022-06-14 DIAGNOSIS — M1711 Unilateral primary osteoarthritis, right knee: Secondary | ICD-10-CM | POA: Diagnosis not present

## 2022-06-15 ENCOUNTER — Other Ambulatory Visit: Payer: Self-pay | Admitting: *Deleted

## 2022-06-16 ENCOUNTER — Other Ambulatory Visit: Payer: Self-pay | Admitting: *Deleted

## 2022-06-16 ENCOUNTER — Encounter: Payer: Self-pay | Admitting: *Deleted

## 2022-06-16 MED ORDER — CLENPIQ 10-3.5-12 MG-GM -GM/175ML PO SOLN: 1.0000 | ORAL | 0 refills | Status: DC

## 2022-06-20 ENCOUNTER — Telehealth: Payer: Self-pay | Admitting: Internal Medicine

## 2022-06-20 DIAGNOSIS — R062 Wheezing: Secondary | ICD-10-CM | POA: Diagnosis not present

## 2022-06-20 DIAGNOSIS — J019 Acute sinusitis, unspecified: Secondary | ICD-10-CM | POA: Diagnosis not present

## 2022-06-20 DIAGNOSIS — J069 Acute upper respiratory infection, unspecified: Secondary | ICD-10-CM | POA: Diagnosis not present

## 2022-06-20 NOTE — Telephone Encounter (Signed)
Pt came to front window this afternoon asking if it was ok for him to have his procedure on Thursday with a sinus infection. He said he tested negative for covid and the flu, but wanted to make sure. I told him someone will call him tomorrow. [phone]

## 2022-06-21 ENCOUNTER — Encounter: Payer: Self-pay | Admitting: *Deleted

## 2022-06-21 NOTE — Telephone Encounter (Signed)
Keith Solis, Pt would like to know if he can still have his procedure with a sinus infection. He had a visit to the ER this year with syncope. Please advise

## 2022-06-21 NOTE — Telephone Encounter (Signed)
Pt has decided to reschedule his procedure because he is very congested in his chest. Pt was advised that a scheduler will reach out to him later to reschedule. The pt was fine with that.

## 2022-06-21 NOTE — Telephone Encounter (Signed)
LMTRC

## 2022-06-21 NOTE — Telephone Encounter (Signed)
Pt has been rescheduled from 06/23/22 at 8:45 am until 07/19/22 at 9:00 am, New set of instructions given to pt.

## 2022-06-28 ENCOUNTER — Telehealth: Payer: Self-pay | Admitting: Gastroenterology

## 2022-06-28 DIAGNOSIS — M1711 Unilateral primary osteoarthritis, right knee: Secondary | ICD-10-CM | POA: Diagnosis not present

## 2022-06-28 NOTE — Telephone Encounter (Signed)
Prior endoscopy records received and reviewed today.  Colonoscopy 11/07/2017 with [facility]: Hemorrhoids on perianal exam Two 3-4 mm polyps in the ascending colon and cecum. Single 8 mm polyp in the descending colon  Pathology results: Cecal polyp -serrated, ascending colon polyp-hyperplastic, descending colon polyp- tubular adenoma  Advised repeat colonoscopy in 5 years for surveillance.    Mindy/Tammy R -unit patient was recommended a 5-year surveillance period, he would not be due for colonoscopy until 2024.  Please ask patient if he wants to proceed with early interval colonoscopy at time of EGD or if he would rather wait until next year to complete.  Without any alarm symptoms he does not necessarily need colonoscopy now, can wait an additional 6 months to 1 year and have performed in 2024 if you would like to.  He could also confirm any potential cost with insurance prior to proceeding.  Venetia Night, MSN, APRN, FNP-BC, AGACNP-BC [facility]

## 2022-06-29 NOTE — Telephone Encounter (Signed)
Pt informed of message. He states that he will go ahead with the colonoscopy and insurance has already approved it.

## 2022-07-04 DIAGNOSIS — Z125 Encounter for screening for malignant neoplasm of prostate: Secondary | ICD-10-CM | POA: Diagnosis not present

## 2022-07-04 DIAGNOSIS — E039 Hypothyroidism, unspecified: Secondary | ICD-10-CM | POA: Diagnosis not present

## 2022-07-04 DIAGNOSIS — I10 Essential (primary) hypertension: Secondary | ICD-10-CM | POA: Diagnosis not present

## 2022-07-04 DIAGNOSIS — R7303 Prediabetes: Secondary | ICD-10-CM | POA: Diagnosis not present

## 2022-07-04 DIAGNOSIS — E291 Testicular hypofunction: Secondary | ICD-10-CM | POA: Diagnosis not present

## 2022-07-05 DIAGNOSIS — M1711 Unilateral primary osteoarthritis, right knee: Secondary | ICD-10-CM | POA: Diagnosis not present

## 2022-07-11 DIAGNOSIS — I10 Essential (primary) hypertension: Secondary | ICD-10-CM | POA: Diagnosis not present

## 2022-07-11 DIAGNOSIS — E1165 Type 2 diabetes mellitus with hyperglycemia: Secondary | ICD-10-CM | POA: Diagnosis not present

## 2022-07-11 DIAGNOSIS — G629 Polyneuropathy, unspecified: Secondary | ICD-10-CM | POA: Diagnosis not present

## 2022-07-11 DIAGNOSIS — E039 Hypothyroidism, unspecified: Secondary | ICD-10-CM | POA: Diagnosis not present

## 2022-07-11 DIAGNOSIS — J309 Allergic rhinitis, unspecified: Secondary | ICD-10-CM | POA: Diagnosis not present

## 2022-07-11 DIAGNOSIS — E291 Testicular hypofunction: Secondary | ICD-10-CM | POA: Diagnosis not present

## 2022-07-11 DIAGNOSIS — R0683 Snoring: Secondary | ICD-10-CM | POA: Diagnosis not present

## 2022-07-11 DIAGNOSIS — F17201 Nicotine dependence, unspecified, in remission: Secondary | ICD-10-CM | POA: Diagnosis not present

## 2022-07-11 DIAGNOSIS — R0981 Nasal congestion: Secondary | ICD-10-CM | POA: Insufficient documentation

## 2022-07-11 DIAGNOSIS — M25562 Pain in left knee: Secondary | ICD-10-CM | POA: Diagnosis not present

## 2022-07-12 DIAGNOSIS — M1711 Unilateral primary osteoarthritis, right knee: Secondary | ICD-10-CM | POA: Diagnosis not present

## 2022-07-15 DIAGNOSIS — M9902 Segmental and somatic dysfunction of thoracic region: Secondary | ICD-10-CM | POA: Diagnosis not present

## 2022-07-15 DIAGNOSIS — M9903 Segmental and somatic dysfunction of lumbar region: Secondary | ICD-10-CM | POA: Diagnosis not present

## 2022-07-15 DIAGNOSIS — M546 Pain in thoracic spine: Secondary | ICD-10-CM | POA: Diagnosis not present

## 2022-07-15 DIAGNOSIS — M9901 Segmental and somatic dysfunction of cervical region: Secondary | ICD-10-CM | POA: Diagnosis not present

## 2022-07-15 DIAGNOSIS — M542 Cervicalgia: Secondary | ICD-10-CM | POA: Diagnosis not present

## 2022-07-19 ENCOUNTER — Other Ambulatory Visit: Payer: Self-pay

## 2022-07-19 ENCOUNTER — Encounter (HOSPITAL_COMMUNITY)
Admission: RE | Disposition: A | Discharge status: Home / Self Care | Payer: Self-pay | Source: Home / Self Care | Attending: Internal Medicine

## 2022-07-19 ENCOUNTER — Ambulatory Visit (HOSPITAL_BASED_OUTPATIENT_CLINIC_OR_DEPARTMENT_OTHER): Payer: Medicare PPO | Admitting: Anesthesiology

## 2022-07-19 ENCOUNTER — Ambulatory Visit (HOSPITAL_COMMUNITY)
Admission: RE | Admit: 2022-07-19 | Discharge: 2022-07-19 | Disposition: A | Discharge status: Home / Self Care | Payer: Medicare PPO | Attending: Internal Medicine | Admitting: Internal Medicine

## 2022-07-19 ENCOUNTER — Encounter (HOSPITAL_COMMUNITY): Payer: Self-pay

## 2022-07-19 ENCOUNTER — Ambulatory Visit (HOSPITAL_COMMUNITY): Payer: Medicare PPO | Admitting: Anesthesiology

## 2022-07-19 DIAGNOSIS — K222 Esophageal obstruction: Secondary | ICD-10-CM

## 2022-07-19 DIAGNOSIS — G4730 Sleep apnea, unspecified: Secondary | ICD-10-CM | POA: Diagnosis not present

## 2022-07-19 DIAGNOSIS — I517 Cardiomegaly: Secondary | ICD-10-CM | POA: Diagnosis not present

## 2022-07-19 DIAGNOSIS — Z86010 Personal history of colonic polyps: Secondary | ICD-10-CM | POA: Insufficient documentation

## 2022-07-19 DIAGNOSIS — R1310 Dysphagia, unspecified: Secondary | ICD-10-CM | POA: Diagnosis not present

## 2022-07-19 DIAGNOSIS — K449 Diaphragmatic hernia without obstruction or gangrene: Secondary | ICD-10-CM

## 2022-07-19 DIAGNOSIS — Z87891 Personal history of nicotine dependence: Secondary | ICD-10-CM | POA: Diagnosis not present

## 2022-07-19 DIAGNOSIS — Z1211 Encounter for screening for malignant neoplasm of colon: Secondary | ICD-10-CM | POA: Insufficient documentation

## 2022-07-19 DIAGNOSIS — I10 Essential (primary) hypertension: Secondary | ICD-10-CM | POA: Insufficient documentation

## 2022-07-19 DIAGNOSIS — I493 Ventricular premature depolarization: Secondary | ICD-10-CM | POA: Diagnosis not present

## 2022-07-19 DIAGNOSIS — K219 Gastro-esophageal reflux disease without esophagitis: Secondary | ICD-10-CM | POA: Insufficient documentation

## 2022-07-19 DIAGNOSIS — K648 Other hemorrhoids: Secondary | ICD-10-CM | POA: Diagnosis not present

## 2022-07-19 DIAGNOSIS — K2970 Gastritis, unspecified, without bleeding: Secondary | ICD-10-CM

## 2022-07-19 DIAGNOSIS — D120 Benign neoplasm of cecum: Secondary | ICD-10-CM | POA: Diagnosis not present

## 2022-07-19 DIAGNOSIS — R0683 Snoring: Secondary | ICD-10-CM | POA: Diagnosis not present

## 2022-07-19 DIAGNOSIS — Z09 Encounter for follow-up examination after completed treatment for conditions other than malignant neoplasm: Secondary | ICD-10-CM | POA: Diagnosis not present

## 2022-07-19 HISTORY — PX: POLYPECTOMY: SHX5525

## 2022-07-19 HISTORY — PX: COLONOSCOPY WITH PROPOFOL: SHX5780

## 2022-07-19 HISTORY — PX: ESOPHAGOGASTRODUODENOSCOPY (EGD) WITH PROPOFOL: SHX5813

## 2022-07-19 HISTORY — PX: BALLOON DILATION: SHX5330

## 2022-07-19 HISTORY — PX: BIOPSY: SHX5522

## 2022-07-19 SURGERY — COLONOSCOPY WITH PROPOFOL
Anesthesia: General

## 2022-07-19 MED ORDER — LACTATED RINGERS IV SOLN: INTRAVENOUS | Status: DC

## 2022-07-19 MED ORDER — PROPOFOL 10 MG/ML IV BOLUS
INTRAVENOUS | Status: DC | PRN
  Administered 2022-07-19: 120 mg via INTRAVENOUS

## 2022-07-19 MED ORDER — PANTOPRAZOLE SODIUM 40 MG PO TBEC: 40.0000 mg | DELAYED_RELEASE_TABLET | Freq: Two times a day (BID) | ORAL | 11 refills | Status: DC

## 2022-07-19 MED ORDER — PROPOFOL 500 MG/50ML IV EMUL
INTRAVENOUS | Status: DC | PRN
  Administered 2022-07-19: 200 ug/kg/min via INTRAVENOUS

## 2022-07-19 MED ORDER — LIDOCAINE HCL (CARDIAC) PF 100 MG/5ML IV SOSY
PREFILLED_SYRINGE | INTRAVENOUS | Status: DC | PRN
  Administered 2022-07-19: 50 mg via INTRATRACHEAL

## 2022-07-19 NOTE — Anesthesia Postprocedure Evaluation (Signed)
Anesthesia Post Note  Patient: Keith Solis  Procedure(s) Performed: COLONOSCOPY WITH PROPOFOL ESOPHAGOGASTRODUODENOSCOPY (EGD) WITH PROPOFOL BALLOON DILATION BIOPSY POLYPECTOMY  Patient location during evaluation: Phase II Anesthesia Type: General Level of consciousness: awake and alert and oriented Pain management: pain level controlled Vital Signs Assessment: post-procedure vital signs reviewed and stable Respiratory status: spontaneous breathing, nonlabored ventilation and respiratory function stable Cardiovascular status: blood pressure returned to baseline and stable Postop Assessment: no apparent nausea or vomiting Anesthetic complications: no   No notable events documented.   Last Vitals:  Vitals:   07/19/22 0924 07/19/22 0927  BP: (!) 79/66 114/71  Pulse: 79 84  Resp: 20 14  Temp: 37.1 C   SpO2: 95% 96%    Last Pain:  Vitals:   07/19/22 0924  TempSrc: Oral  PainSc: 0-No pain                 Rolly Magri C Brande Uncapher

## 2022-07-19 NOTE — Discharge Instructions (Addendum)
EGD Discharge instructions Please read the instructions outlined below and refer to this sheet in the next few weeks. These discharge instructions provide you with general information on caring for yourself after you leave the hospital. Your doctor may also give you specific instructions. While your treatment has been planned according to the most current medical practices available, unavoidable complications occasionally occur. If you have any problems or questions after discharge, please call your doctor. ACTIVITY You may resume your regular activity but move at a slower pace for the next 24 hours.  Take frequent rest periods for the next 24 hours.  Walking will help expel (get rid of) the air and reduce the bloated feeling in your abdomen.  No driving for 24 hours (because of the anesthesia (medicine) used during the test).  You may shower.  Do not sign any important legal documents or operate any machinery for 24 hours (because of the anesthesia used during the test).  NUTRITION Drink plenty of fluids.  You may resume your normal diet.  Begin with a light meal and progress to your normal diet.  Avoid alcoholic beverages for 24 hours or as instructed by your caregiver.  MEDICATIONS You may resume your normal medications unless your caregiver tells you otherwise.  WHAT YOU CAN EXPECT TODAY You may experience abdominal discomfort such as a feeling of fullness or "gas" pains.  FOLLOW-UP Your doctor will discuss the results of your test with you.  SEEK IMMEDIATE MEDICAL ATTENTION IF ANY OF THE FOLLOWING OCCUR: Excessive nausea (feeling sick to your stomach) and/or vomiting.  Severe abdominal pain and distention (swelling).  Trouble swallowing.  Temperature over 101 F (37.8 C).  Rectal bleeding or vomiting of blood.    Colonoscopy Discharge Instructions  Read the instructions outlined below and refer to this sheet in the next few weeks. These discharge instructions provide you with  general information on caring for yourself after you leave the hospital. Your doctor may also give you specific instructions. While your treatment has been planned according to the most current medical practices available, unavoidable complications occasionally occur.   ACTIVITY You may resume your regular activity, but move at a slower pace for the next 24 hours.  Take frequent rest periods for the next 24 hours.  Walking will help get rid of the air and reduce the bloated feeling in your belly (abdomen).  No driving for 24 hours (because of the medicine (anesthesia) used during the test).   Do not sign any important legal documents or operate any machinery for 24 hours (because of the anesthesia used during the test).  NUTRITION Drink plenty of fluids.  You may resume your normal diet as instructed by your doctor.  Begin with a light meal and progress to your normal diet. Heavy or fried foods are harder to digest and may make you feel sick to your stomach (nauseated).  Avoid alcoholic beverages for 24 hours or as instructed.  MEDICATIONS You may resume your normal medications unless your doctor tells you otherwise.  WHAT YOU CAN EXPECT TODAY Some feelings of bloating in the abdomen.  Passage of more gas than usual.  Spotting of blood in your stool or on the toilet paper.  IF YOU HAD POLYPS REMOVED DURING THE COLONOSCOPY: No aspirin products for 7 days or as instructed.  No alcohol for 7 days or as instructed.  Eat a soft diet for the next 24 hours.  FINDING OUT THE RESULTS OF YOUR TEST Not all test results are available  during your visit. If your test results are not back during the visit, make an appointment with your caregiver to find out the results. Do not assume everything is normal if you have not heard from your caregiver or the medical facility. It is important for you to follow up on all of your test results.  SEEK IMMEDIATE MEDICAL ATTENTION IF: You have more than a spotting of  blood in your stool.  Your belly is swollen (abdominal distention).  You are nauseated or vomiting.  You have a temperature over 101.  You have abdominal pain or discomfort that is severe or gets worse throughout the day.   Your EGD revealed mild amount inflammation in your stomach.  I took biopsies of this to rule out infection with a bacteria called H. pylori.  Await pathology results, my office will contact you.  You also had evidence of inflammation in your esophagus which I also sampled.  You have a small hiatal hernia as well as a Schatzki's ring which is likely causing your difficulty with certain foods.  I stretched this today.  Hopefully this helps with your swallowing.  I am going to start you on a new medication called pantoprazole 40 mg.  I want you to take this twice daily for 12 weeks at which point you can decrease down to once daily.  Limit NSAID use.  Your colonoscopy revealed 1 polyp(s) which I removed successfully. Await pathology results, my office will contact you. I recommend repeating colonoscopy in 5 years for surveillance purposes.    I hope you have a great rest of your week!  Elon Alas. Abbey Chatters, D.O. Gastroenterology and Hepatology [facility]

## 2022-07-19 NOTE — H&P (Signed)
Primary Care Physician:  Celene Squibb, MD Primary Gastroenterologist:  Dr. Abbey Chatters  Pre-Procedure History & Physical: HPI:  Keith Solis is a 66 y.o. male is here for an EGD with possible esophageal dilation due to dysphagia/GERD and colonoscopy due to history of adenomatous colon polyps.  Past Medical History:  Diagnosis Date   Hypertension    Neuropathy     Past Surgical History:  Procedure Laterality Date   cholecystectomy N/A    INGUINAL HERNIA REPAIR     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    Prior to Admission medications   Medication Sig Start Date End Date Taking? Authorizing Provider  acetaminophen (TYLENOL) 500 MG tablet Take 1,000 mg by mouth every 6 (six) hours as needed for moderate pain.   Yes [provider]  amLODipine (NORVASC) 5 MG tablet Take 5 mg by mouth daily.   Yes [provider]  diphenhydramine-acetaminophen (TYLENOL PM) 25-500 MG TABS tablet Take 1 tablet by mouth at bedtime as needed (sleep).   Yes [provider]  gabapentin (NEURONTIN) 300 MG capsule Take 300 mg by mouth 3 (three) times daily.   Yes [provider]  levothyroxine (SYNTHROID) 75 MCG tablet Take 75 mcg by mouth daily before breakfast.   Yes [provider]  lisinopril (ZESTRIL) 40 MG tablet Take 40 mg by mouth daily.   Yes [provider]  Multiple Vitamin (MULTIVITAMIN WITH MINERALS) TABS tablet Take 1 tablet by mouth daily.   Yes [provider]  sildenafil (VIAGRA) 50 MG tablet Take 50 mg by mouth daily as needed for erectile dysfunction.   Yes [provider]  testosterone cypionate (DEPOTESTOSTERONE CYPIONATE) 200 MG/ML injection Inject 200 mg into the muscle every 14 (fourteen) days. 04/27/22  Yes [provider]  albuterol (VENTOLIN HFA) 108 (90 Base) MCG/ACT inhaler Inhale 2 puffs into the lungs every 6 (six) hours as needed for wheezing or shortness of breath.    [provider]  ipratropium  (ATROVENT) 0.03 % nasal spray Place 2 sprays into both nostrils 2 (two) times daily as needed for rhinitis.    [provider]  Sod Picosulfate-Mag Ox-Cit Acd (CLENPIQ) 10-3.5-12 MG-GM -GM/175ML SOLN Take 1 kit by mouth as directed. 06/16/22   Eloise Harman, DO    Allergies as of 05/30/2022   (No Known Allergies)    Family History  Problem Relation Age of Onset   Colon polyps Mother    Colon polyps Father    Colon polyps Sister    Colon polyps Sister    Colon cancer Neg Hx     Social History   Socioeconomic History   Marital status: Married    Spouse name: Not on file   Number of children: Not on file   Years of education: Not on file   Highest education level: Not on file  Occupational History   Not on file  Tobacco Use   Smoking status: Former    Types: Cigarettes   Smokeless tobacco: Never  Vaping Use   Vaping Use: Never used  Substance and Sexual Activity   Alcohol use: Not Currently   Drug use: Never   Sexual activity: Yes  Other Topics Concern   Not on file  Social History Narrative   Not on file   Social Determinants of Health   Financial Resource Strain: Not on file  Food Insecurity: Not on file  Transportation Needs: Not on file  Physical Activity: Not on file  Stress: Not on  file  Social Connections: Not on file  Intimate Partner Violence: Not on file    Review of Systems: See HPI, otherwise negative ROS  Physical Exam: Vital signs in last 24 hours: Temp:  [97.7 F (36.5 C)] 97.7 F (36.5 C) (10/31 0752) Pulse Rate:  [70] 70 (10/31 0752) Resp:  [11] 11 (10/31 0752) BP: (130)/(75) 130/75 (10/31 0752) SpO2:  [93 %] 93 % (10/31 0752) Weight:  [121.6 kg] 121.6 kg (10/31 0752)   General:   Alert,  Well-developed, well-nourished, pleasant and cooperative in NAD Head:  Normocephalic and atraumatic. Eyes:  Sclera clear, no icterus.   Conjunctiva pink. Ears:  Normal auditory acuity. Nose:  No deformity, discharge,  or  lesions. Mouth:  No deformity or lesions, dentition normal. Neck:  Supple; no masses or thyromegaly. Lungs:  Clear throughout to auscultation.   No wheezes, crackles, or rhonchi. No acute distress. Heart:  Regular rate and rhythm; no murmurs, clicks, rubs,  or gallops. Abdomen:  Soft, nontender and nondistended. No masses, hepatosplenomegaly or hernias noted. Normal bowel sounds, without guarding, and without rebound.   Msk:  Symmetrical without gross deformities. Normal posture. Extremities:  Without clubbing or edema. Neurologic:  Alert and  oriented x4;  grossly normal neurologically. Skin:  Intact without significant lesions or rashes. Cervical Nodes:  No significant cervical adenopathy. Psych:  Alert and cooperative. Normal mood and affect.  Impression/Plan: Keith Solis is here for an EGD with possible esophageal dilation due to dysphagia/GERD and colonoscopy due to history of adenomatous colon polyps.  The risks of the procedure including infection, bleed, or perforation as well as benefits, limitations, alternatives and imponderables have been reviewed with the patient. Questions have been answered. All parties agreeable.

## 2022-07-19 NOTE — Op Note (Signed)
[facility] Patient Name: Keith Solis Procedure Date: 07/19/2022 9:07 AM MRN: [id number] Date of Birth: 26-Sep-1955 Attending MD: Elon Alas. Abbey Chatters , [place], [id number] CSN: [id number] Age: 66 Admit Type: Outpatient Procedure:                Colonoscopy Indications:              Surveillance: Personal history of adenomatous                            polyps on last colonoscopy > 3 years ago Providers:                Elon Alas. Abbey Chatters, DO, Lambert Mody, Dereck Leep, Technician Referring MD:              Medicines:                See the Anesthesia note for documentation of the                            administered medications Complications:            No immediate complications. Estimated Blood Loss:     Estimated blood loss was minimal. Procedure:                Pre-Anesthesia Assessment:                           - The anesthesia plan was to use monitored                            anesthesia care (MAC).                           After obtaining informed consent, the colonoscope                            was passed under direct vision. Throughout the                            procedure, the patient's blood pressure, pulse, and                            oxygen saturations were monitored continuously. The                            [id number]-HQ190L ([id number]) scope was introduced through                            the anus and advanced to the the cecum, identified                            by appendiceal orifice and ileocecal valve. The                            colonoscopy was  performed without difficulty. The                            patient tolerated the procedure well. The quality                            of the bowel preparation was evaluated using the                            BBPS [facility] Bowel Preparation Scale) with scores                            of: Right Colon = 3, Transverse Colon = 3 and Left                             Colon = 3 (entire mucosa seen well with no residual                            staining, small fragments of stool or opaque                            liquid). The total BBPS score equals 9. Scope In: 9:09:24 AM Scope Out: 9:19:41 AM Scope Withdrawal Time: 0 hours 8 minutes 58 seconds  Total Procedure Duration: 0 hours 10 minutes 17 seconds  Findings:      The perianal and digital rectal examinations were normal.      Non-bleeding internal hemorrhoids were found during endoscopy.      A 4 mm polyp was found in the cecum. The polyp was sessile. The polyp       was removed with a cold snare. Resection and retrieval were complete.      The exam was otherwise without abnormality. Impression:               - Non-bleeding internal hemorrhoids.                           - One 4 mm polyp in the cecum, removed with a cold                            snare. Resected and retrieved.                           - The examination was otherwise normal. Moderate Sedation:      Per Anesthesia Care Recommendation:           - Patient has a contact number available for                            emergencies. The signs and symptoms of potential                            delayed complications were discussed with the                            patient. Return to normal  activities tomorrow.                            Written discharge instructions were provided to the                            patient.                           - Resume previous diet.                           - Continue present medications.                           - Await pathology results.                           - Repeat colonoscopy in 5 years for surveillance                            and history of polyps prior.                           - Return to GI clinic in 3 months. Procedure Code(s):        --- Professional ---                           [phone], Colonoscopy, flexible; with removal of                            tumor(s),  polyp(s), or other lesion(s) by snare                            technique Diagnosis Code(s):        --- Professional ---                           Z86.010, Personal history of colonic polyps                           D12.0, Benign neoplasm of cecum                           K64.8, Other hemorrhoids CPT copyright 2022 American Medical Association. All rights reserved. The codes documented in this report are preliminary and upon coder review may  be revised to meet current compliance requirements. Elon Alas. Abbey Chatters, DO [place] Abbey Chatters, DO 07/19/2022 9:25:22 AM This report has been signed electronically. Number of Addenda: 0

## 2022-07-19 NOTE — Anesthesia Preprocedure Evaluation (Addendum)
Anesthesia Evaluation  Patient identified by MRN, date of birth, ID band Patient awake    Reviewed: Allergy & Precautions, NPO status , Patient's Chart, lab work & pertinent test results  Airway Mallampati: III  TM Distance: >3 FB Neck ROM: Full    Dental  (+) Dental Advisory Given, Missing   Pulmonary neg sleep apnea (snoring), former smoker,    Pulmonary exam normal breath sounds clear to auscultation       Cardiovascular Exercise Tolerance: Good hypertension, Pt. on medications Normal cardiovascular exam+ dysrhythmias (PVCs)  Rhythm:Regular Rate:Normal  08/2023cardiology note reviewed Echo 1. Left ventricular ejection fraction, by estimation, is 55 to 60%. The  left ventricle has normal function. Left ventricular endocardial border  not optimally defined to evaluate regional wall motion, though no seen in  views obtained (cannot see  anterolated myocardial borders well). There is mild concentric left  ventricular hypertrophy. Left ventricular diastolic parameters are  indeterminate.  2. Right ventricular systolic function is normal. The right ventricular  size is normal. Tricuspid regurgitation signal is inadequate for assessing  PA pressure.  3. Left atrial size was moderately dilated.  4. The mitral valve is normal in structure. No evidence of mitral valve  regurgitation. No evidence of mitral stenosis.  5. The aortic valve is tricuspid. Aortic valve regurgitation is not  visualized. No aortic stenosis is present.  6. Aortic dilatation noted. There is mild dilatation of the aortic root,  measuring 40 mm.  7. The inferior vena cava is normal in size with greater than 50%  respiratory variability, suggesting right atrial pressure of 3 mmHg.    Neuro/Psych negative neurological ROS  negative psych ROS   GI/Hepatic negative GI ROS, Neg liver ROS,   Endo/Other  negative endocrine ROS  Renal/GU negative Renal  ROS  negative genitourinary   Musculoskeletal negative musculoskeletal ROS (+)   Abdominal   Peds negative pediatric ROS (+)  Hematology negative hematology ROS (+)   Anesthesia Other Findings 15-Apr-2022 09:45:24 [facility] ROUTINE RECORD October 22, 1955 (53 yr) Male Caucasian Vent. rate 87 BPM PR interval 153 ms QRS duration 98 ms QT/QTcB 340/409 ms P-R-T axes 42 67 54 Sinus rhythm Normal ECG Confirmed by Noemi Chapel [phone]) on 04/15/2022 10:02:54 AM  Reproductive/Obstetrics negative OB ROS                           Anesthesia Physical Anesthesia Plan  ASA: 2  Anesthesia Plan: General   Post-op Pain Management: Minimal or no pain anticipated   Induction: Intravenous  PONV Risk Score and Plan: TIVA  Airway Management Planned: Nasal Cannula and Natural Airway  Additional Equipment:   Intra-op Plan:   Post-operative Plan:   Informed Consent: I have reviewed the patients History and Physical, chart, labs and discussed the procedure including the risks, benefits and alternatives for the proposed anesthesia with the patient or authorized representative who has indicated his/her understanding and acceptance.     Dental advisory given  Plan Discussed with: CRNA and Surgeon  Anesthesia Plan Comments:         Anesthesia Quick Evaluation

## 2022-07-19 NOTE — Transfer of Care (Signed)
Immediate Anesthesia Transfer of Care Note  Patient: Keith Solis  Procedure(s) Performed: COLONOSCOPY WITH PROPOFOL ESOPHAGOGASTRODUODENOSCOPY (EGD) WITH PROPOFOL BALLOON DILATION BIOPSY POLYPECTOMY  Patient Location: Endoscopy Unit  Anesthesia Type:General  Level of Consciousness: sedated, patient cooperative and responds to stimulation  Airway & Oxygen Therapy: Patient Spontanous Breathing  Post-op Assessment: Report given to RN, Post -op Vital signs reviewed and stable and Patient moving all extremities  Post vital signs: Reviewed and stable  Last Vitals:  Vitals Value Taken Time  BP    Temp    Pulse    Resp    SpO2      Last Pain:  Vitals:   07/19/22 0856  TempSrc:   PainSc: 0-No pain      Patients Stated Pain Goal: 8 (91/50/56 9794)  Complications: No notable events documented.

## 2022-07-19 NOTE — Op Note (Signed)
[facility] Patient Name: Keith Solis Procedure Date: 07/19/2022 8:49 AM MRN: [id number] Date of Birth: 02-Jul-1956 Attending MD: Elon Alas. Abbey Chatters , [place], [id number] CSN: [id number] Age: 66 Admit Type: Outpatient Procedure:                Upper GI endoscopy Indications:              Dysphagia, Heartburn Providers:                Elon Alas. Abbey Chatters, DO, Lambert Mody, Dereck Leep, Technician Referring MD:              Medicines:                See the Anesthesia note for documentation of the                            administered medications Complications:            No immediate complications. Estimated Blood Loss:     Estimated blood loss was minimal. Procedure:                Pre-Anesthesia Assessment:                           - The anesthesia plan was to use monitored                            anesthesia care (MAC).                           After obtaining informed consent, the endoscope was                            passed under direct vision. Throughout the                            procedure, the patient's blood pressure, pulse, and                            oxygen saturations were monitored continuously. The                            [id number]-[id number] ([id number]) scope was introduced through the                            mouth, and advanced to the second part of duodenum.                            The upper GI endoscopy was accomplished without                            difficulty. The patient tolerated the procedure                            well. Scope In: 8:59:24  AM Scope Out: 9:04:51 AM Total Procedure Duration: 0 hours 5 minutes 27 seconds  Findings:      A 3 cm hiatal hernia was present.      A moderate Schatzki ring was found in the lower third of the esophagus.       A TTS dilator was passed through the scope. Dilation with an 18-19-20 mm       balloon dilator was performed to 18 mm. The dilation site was examined        and showed mild mucosal disruption and moderate improvement in luminal       narrowing.      Biopsies were taken with a cold forceps in the middle third of the       esophagus for histology.      Patchy mild inflammation characterized by erythema was found in the       gastric body. Biopsies were taken with a cold forceps for Helicobacter       pylori testing.      The duodenal bulb, first portion of the duodenum and second portion of       the duodenum were normal. Impression:               - 3 cm hiatal hernia.                           - Moderate Schatzki ring. Dilated.                           - Gastritis. Biopsied.                           - Normal duodenal bulb, first portion of the                            duodenum and second portion of the duodenum.                           - Biopsies were taken with a cold forceps for                            histology in the middle third of the esophagus. Moderate Sedation:      Per Anesthesia Care Recommendation:           - Patient has a contact number available for                            emergencies. The signs and symptoms of potential                            delayed complications were discussed with the                            patient. Return to normal activities tomorrow.                            Written discharge instructions were provided to the  patient.                           - Resume previous diet.                           - Continue present medications.                           - Await pathology results.                           - Repeat upper endoscopy PRN for retreatment.                           - Use a proton pump inhibitor PO BID.                           - Return to GI clinic in 3 months. Procedure Code(s):        --- Professional ---                           [phone], Esophagogastroduodenoscopy, flexible,                            transoral; with transendoscopic balloon  dilation of                            esophagus (less than 30 mm diameter)                           43239, 59, Esophagogastroduodenoscopy, flexible,                            transoral; with biopsy, single or multiple Diagnosis Code(s):        --- Professional ---                           K44.9, Diaphragmatic hernia without obstruction or                            gangrene                           K22.2, Esophageal obstruction                           K29.70, Gastritis, unspecified, without bleeding                           R13.10, Dysphagia, unspecified                           R12, Heartburn CPT copyright 2022 American Medical Association. All rights reserved. The codes documented in this report are preliminary and upon coder review may  be revised to meet current compliance requirements. Elon Alas. Abbey Chatters, DO [place] Tamrah Victorino, DO 07/19/2022 9:07:50 AM This report has been signed electronically. Number  of Addenda: 0

## 2022-07-22 LAB — SURGICAL PATHOLOGY

## 2022-07-25 ENCOUNTER — Encounter (HOSPITAL_COMMUNITY): Payer: Self-pay | Admitting: Internal Medicine

## 2022-08-04 DIAGNOSIS — E291 Testicular hypofunction: Secondary | ICD-10-CM | POA: Diagnosis not present

## 2022-08-19 DIAGNOSIS — M546 Pain in thoracic spine: Secondary | ICD-10-CM | POA: Diagnosis not present

## 2022-08-19 DIAGNOSIS — M9901 Segmental and somatic dysfunction of cervical region: Secondary | ICD-10-CM | POA: Diagnosis not present

## 2022-08-19 DIAGNOSIS — M9903 Segmental and somatic dysfunction of lumbar region: Secondary | ICD-10-CM | POA: Diagnosis not present

## 2022-08-19 DIAGNOSIS — M9902 Segmental and somatic dysfunction of thoracic region: Secondary | ICD-10-CM | POA: Diagnosis not present

## 2022-08-19 DIAGNOSIS — M542 Cervicalgia: Secondary | ICD-10-CM | POA: Diagnosis not present

## 2022-09-01 DIAGNOSIS — I10 Essential (primary) hypertension: Secondary | ICD-10-CM | POA: Diagnosis not present

## 2022-09-01 DIAGNOSIS — G629 Polyneuropathy, unspecified: Secondary | ICD-10-CM | POA: Diagnosis not present

## 2022-09-01 DIAGNOSIS — K219 Gastro-esophageal reflux disease without esophagitis: Secondary | ICD-10-CM | POA: Diagnosis not present

## 2022-09-01 DIAGNOSIS — Z87891 Personal history of nicotine dependence: Secondary | ICD-10-CM | POA: Diagnosis not present

## 2022-09-01 DIAGNOSIS — E039 Hypothyroidism, unspecified: Secondary | ICD-10-CM | POA: Diagnosis not present

## 2022-09-01 DIAGNOSIS — Z6835 Body mass index (BMI) 35.0-35.9, adult: Secondary | ICD-10-CM | POA: Diagnosis not present

## 2022-09-01 DIAGNOSIS — Z8052 Family history of malignant neoplasm of bladder: Secondary | ICD-10-CM | POA: Diagnosis not present

## 2022-09-01 DIAGNOSIS — Z808 Family history of malignant neoplasm of other organs or systems: Secondary | ICD-10-CM | POA: Diagnosis not present

## 2022-09-05 ENCOUNTER — Encounter: Payer: Medicare PPO | Admitting: Nutrition

## 2022-09-07 DIAGNOSIS — E291 Testicular hypofunction: Secondary | ICD-10-CM | POA: Diagnosis not present

## 2022-09-29 ENCOUNTER — Encounter: Payer: Self-pay | Admitting: Internal Medicine

## 2022-09-29 ENCOUNTER — Ambulatory Visit: Payer: Medicare PPO | Admitting: Internal Medicine

## 2022-09-29 VITALS — BP 120/76 | HR 58 | Ht 72.0 in | Wt 272.6 lb

## 2022-09-29 DIAGNOSIS — R0609 Other forms of dyspnea: Secondary | ICD-10-CM

## 2022-09-29 DIAGNOSIS — I10 Essential (primary) hypertension: Secondary | ICD-10-CM | POA: Diagnosis not present

## 2022-09-29 MED ORDER — OLMESARTAN MEDOXOMIL 40 MG PO TABS: 40.0000 mg | ORAL_TABLET | Freq: Every day | ORAL | 11 refills | Status: DC

## 2022-09-29 NOTE — Patient Instructions (Addendum)
Only use your albuterol as a rescue medication to be used if you can't catch your breath by resting or doing a relaxed purse lip breathing pattern.  - The less you use it, the better it will work when you need it. - Ok to use up to 2 puffs  every 4 hours if you must but call for immediate appointment if use goes up over your usual need - Don't leave home without it !!  (think of it like the spare tire for your car)   Also  Ok to try albuterol 15 min before an activity (on alternating days)  that you know would usually make you short of breath and see if it makes any difference and if makes none then don't take albuterol after activity unless you can't catch your breath as this means it's the resting that helps, not the albuterol.  Stop lisinopril 40 and substitute olmesartan 40 mg one daily - call me if any problem  Please schedule a follow up office visit in 6 weeks, call sooner if needed   Add discuss lung cancer screening on return if not doing per Dr Juel Burrow office already.

## 2022-09-29 NOTE — Progress Notes (Signed)
Keith Solis, male    DOB: 1956/02/03    MRN: [id number]   Brief patient profile:  89  yowm athlete in HS quit smoking around 2014 with some tendency to sinus infections/sometime bronchitis referred to pulmonary clinic in [facility]  09/29/2022 by Keith Solis for post covid spring of 2020 assoc with 20 lb wt gain     History of Present Illness  09/29/2022  Pulmonary/ 1st office eval/ [name] / [facility] on ACEi  Chief Complaint  Patient presents with   Consult    Pt consult reports having drank kerasin as a child and has lung scarring but feels it is worse   Dyspnea:  slowed down p LKR / wt gain ? Amt since knee problems started   Cough: none  Sleep: bed is flat with 2 pillows under head / wakes up tired /naps a lot  SABA use: doesn't really help 02: none   No obvious day to day or daytime pattern/variability or assoc excess/ purulent sputum or mucus plugs or hemoptysis or cp or chest tightness, subjective wheeze or overt sinus or hb symptoms.   Sleeping as above  without nocturnal  or early am exacerbation  of respiratory  c/o's or need for noct saba. Also denies any obvious fluctuation of symptoms with weather or environmental changes or other aggravating or alleviating factors except as outlined above   No unusual exposure hx or h/o childhood pna/ asthma or knowledge of premature birth.  Current Allergies, Complete Past Medical History, Past Surgical History, Family History, and Social History were reviewed in [employer] record.  ROS  The following are not active complaints unless bolded Hoarseness, sore throat, dysphagia/globus sensation, dental problems, itching, sneezing,  nasal congestion or discharge of excess mucus or purulent secretions, ear ache,   fever, chills, sweats, unintended wt loss or wt gain, classically pleuritic or exertional cp,  orthopnea pnd or arm/hand swelling  or leg swelling, presyncope, palpitations, abdominal pain,  anorexia, nausea, vomiting, diarrhea  or change in bowel habits or change in bladder habits, change in stools or change in urine, dysuria, hematuria,  rash, arthralgias, visual complaints, headache, numbness, weakness or ataxia or problems with walking or coordination,  change in mood or  memory.             Past Medical History:  Diagnosis Date   Hypertension    Neuropathy     Outpatient Medications Prior to Visit  Medication Sig Dispense Refill   acetaminophen (TYLENOL) 500 MG tablet Take 1,000 mg by mouth every 6 (six) hours as needed for moderate pain.     albuterol (VENTOLIN HFA) 108 (90 Base) MCG/ACT inhaler Inhale 2 puffs into the lungs every 6 (six) hours as needed for wheezing or shortness of breath.     amLODipine (NORVASC) 5 MG tablet Take 5 mg by mouth daily.     diphenhydramine-acetaminophen (TYLENOL PM) 25-500 MG TABS tablet Take 1 tablet by mouth at bedtime as needed (sleep).     gabapentin (NEURONTIN) 300 MG capsule Take 300 mg by mouth 3 (three) times daily.     levothyroxine (SYNTHROID) 75 MCG tablet Take 75 mcg by mouth daily before breakfast.     lisinopril (ZESTRIL) 40 MG tablet Take 40 mg by mouth daily.     Multiple Vitamin (MULTIVITAMIN WITH MINERALS) TABS tablet Take 1 tablet by mouth daily.     pantoprazole (PROTONIX) 40 MG tablet Take 1 tablet (40 mg total) by mouth 2 (two)  times daily. 60 tablet 11   testosterone cypionate (DEPOTESTOSTERONE CYPIONATE) 200 MG/ML injection Inject 200 mg into the muscle every 14 (fourteen) days.     ipratropium (ATROVENT) 0.03 % nasal spray Place 2 sprays into both nostrils 2 (two) times daily as needed for rhinitis. (Patient not taking: Reported on 09/29/2022)     sildenafil (VIAGRA) 50 MG tablet Take 50 mg by mouth daily as needed for erectile dysfunction. (Patient not taking: Reported on 09/29/2022)     No facility-administered medications prior to visit.     Objective:     BP 120/76   Pulse (!) 58   Ht 6' (1.829 m)   Wt  272 lb 9.6 oz (123.7 kg)   SpO2 94%   BMI 36.97 kg/m   SpO2: 94 %  Amb mod obese (by BMI) nad minimal pseudowheeze    HEENT : Oropharynx  clear       NECK :  without  apparent JVD/ palpable Nodes/TM    LUNGS: no acc muscle use,  Nl contour chest which is clear to A and P bilaterally without cough on insp or exp maneuvers   CV:  RRR  no s3 or murmur or increase in P2, and no edema   ABD: mod obese/soft and nontender with nl inspiratory excursion in the supine position. No bruits or organomegaly appreciated   MS:  Nl gait/ ext warm without deformities Or obvious joint restrictions  calf tenderness, cyanosis or clubbing    SKIN: warm and dry without lesions    NEURO:  alert, approp, nl sensorium with  no motor or cerebellar deficits apparent.     I personally reviewed images and agree with radiology impression as follows:  CXR:   portable 04/15/22 No evidence of active cardiopulmonary disease       Assessment   DOE (dyspnea on exertion) Quit smoking 2014 s apparent resp problems    When respiratory symptoms begin or become refractory well after a patient reports complete smoking cessation,  Especially when this wasn't the case while they were smoking, a red flag is raised based on the work of Dr Kris Mouton which states:  if you quit smoking when your best day FEV1 is still well preserved it is highly unlikely you will progress to severe disease or start to need saba years p didn't need it while smoking   That is to say, once the smoking stops,  the symptoms should not suddenly erupt or markedly worsen.  If so, the differential diagnosis should include  obesity/deconditioning,  LPR/Reflux/Aspiration syndromes,  occult CHF, or  especially side effect of medications commonly used in this population.    >>> try reconditioning and off acei then return in 6 weeks to regroup   Re SABA :  I spent extra time with pt today reviewing appropriate use of albuterol for prn use on  exertion with the following points: 1) saba is for relief of sob that does not improve by walking a slower pace or resting but rather if the pt does not improve after trying this first. 2) If the pt is convinced, as many are, that saba helps recover from activity faster then it's easy to tell if this is the case by re-challenging : ie stop, take the inhaler, then p 5 minutes try the exact same activity (intensity of workload) that just caused the symptoms and see if they are substantially diminished or not after saba 3) if there is an activity that reproducibly causes the symptoms, try  the saba 15 min before the activity on alternate days   If in fact the saba really does help, then fine to continue to use it prn but advised may need to look closer at the maintenance regimen (for now = nothing)being used to achieve better control of airways disease with exertion.   Essential hypertension Try off acei 09/29/2022 for unexplained sob/ dysphagia and pseudowheeze on exam  In the best review of chronic cough to date ( NEJM 2016 375 [id number]) ,  ACEi are now felt to cause cough in up to  20% of pts which is a 4 fold increase from previous reports and does not include the variety of non-specific complaints we see in pulmonary clinic in pts on ACEi but previously attributed to another dx like  Copd/asthma and  include PNDS, throat congestion/globus sensation , "bronchitis", unexplained dyspnea and noct "strangling" sensations, and hoarseness, but also  atypical /refractory GERD symptoms like dysphagia/globus and "bad heartburn"   The only way I know  to prove this is not an "ACEi Case" is a trial off ACEi x a minimum of 6 weeks then regroup.    >>> try benicar 40 mg daily and recheck in 6 weeks   Discussed in detail all the  indications, usual  risks and alternatives  relative to the benefits with patient who agrees to proceed with Rx as outlined.             Each maintenance medication was reviewed in  detail including emphasizing most importantly the difference between maintenance and prns and under what circumstances the prns are to be triggered using an action plan format where appropriate.  Total time for H and P, chart review, counseling, reviewing hfa device(s) and generating customized AVS unique to this office visit / same day charting = 37 min with pt new to me     Christinia Gully, MD 09/29/2022

## 2022-10-01 ENCOUNTER — Encounter: Payer: Self-pay | Admitting: Internal Medicine

## 2022-10-01 NOTE — Assessment & Plan Note (Addendum)
Try off acei 09/29/2022 for unexplained sob/ dysphagia and pseudowheeze on exam  In the best review of chronic cough to date ( NEJM 2016 375 [id number]) ,  ACEi are now felt to cause cough in up to  20% of pts which is a 4 fold increase from previous reports and does not include the variety of non-specific complaints we see in pulmonary clinic in pts on ACEi but previously attributed to another dx like  Copd/asthma and  include PNDS, throat congestion/globus sensation , "bronchitis", unexplained dyspnea and noct "strangling" sensations, and hoarseness, but also  atypical /refractory GERD symptoms like dysphagia/globus and "bad heartburn"   The only way I know  to prove this is not an "ACEi Case" is a trial off ACEi x a minimum of 6 weeks then regroup.    >>> try benicar 40 mg daily and recheck in 6 weeks   Discussed in detail all the  indications, usual  risks and alternatives  relative to the benefits with patient who agrees to proceed with Rx as outlined.             Each maintenance medication was reviewed in detail including emphasizing most importantly the difference between maintenance and prns and under what circumstances the prns are to be triggered using an action plan format where appropriate.  Total time for H and P, chart review, counseling, reviewing hfa device(s) and generating customized AVS unique to this office visit / same day charting = 37 min with pt new to me

## 2022-10-01 NOTE — Assessment & Plan Note (Addendum)
Quit smoking 2014 s apparent resp problems    When respiratory symptoms begin or become refractory well after a patient reports complete smoking cessation,  Especially when this wasn't the case while they were smoking, a red flag is raised based on the work of Dr Kris Mouton which states:  if you quit smoking when your best day FEV1 is still well preserved it is highly unlikely you will progress to severe disease or start to need saba years p didn't need it while smoking   That is to say, once the smoking stops,  the symptoms should not suddenly erupt or markedly worsen.  If so, the differential diagnosis should include  obesity/deconditioning,  LPR/Reflux/Aspiration syndromes,  occult CHF, or  especially side effect of medications commonly used in this population.    >>> try reconditioning and off acei then return in 6 weeks to regroup   Re SABA :  I spent extra time with pt today reviewing appropriate use of albuterol for prn use on exertion with the following points: 1) saba is for relief of sob that does not improve by walking a slower pace or resting but rather if the pt does not improve after trying this first. 2) If the pt is convinced, as many are, that saba helps recover from activity faster then it's easy to tell if this is the case by re-challenging : ie stop, take the inhaler, then p 5 minutes try the exact same activity (intensity of workload) that just caused the symptoms and see if they are substantially diminished or not after saba 3) if there is an activity that reproducibly causes the symptoms, try the saba 15 min before the activity on alternate days   If in fact the saba really does help, then fine to continue to use it prn but advised may need to look closer at the maintenance regimen (for now = nothing)being used to achieve better control of airways disease with exertion.

## 2022-10-06 DIAGNOSIS — E291 Testicular hypofunction: Secondary | ICD-10-CM | POA: Diagnosis not present

## 2022-10-14 DIAGNOSIS — M62830 Muscle spasm of back: Secondary | ICD-10-CM | POA: Diagnosis not present

## 2022-10-14 DIAGNOSIS — M9902 Segmental and somatic dysfunction of thoracic region: Secondary | ICD-10-CM | POA: Diagnosis not present

## 2022-10-14 DIAGNOSIS — M9901 Segmental and somatic dysfunction of cervical region: Secondary | ICD-10-CM | POA: Diagnosis not present

## 2022-10-14 DIAGNOSIS — M9903 Segmental and somatic dysfunction of lumbar region: Secondary | ICD-10-CM | POA: Diagnosis not present

## 2022-10-14 DIAGNOSIS — M546 Pain in thoracic spine: Secondary | ICD-10-CM | POA: Diagnosis not present

## 2022-10-18 DIAGNOSIS — Z96652 Presence of left artificial knee joint: Secondary | ICD-10-CM | POA: Diagnosis not present

## 2022-10-18 DIAGNOSIS — T8484XA Pain due to internal orthopedic prosthetic devices, implants and grafts, initial encounter: Secondary | ICD-10-CM | POA: Diagnosis not present

## 2022-10-19 NOTE — Progress Notes (Unsigned)
GI Office Note    Referring Provider: Celene Squibb, MD Primary Care Physician:  Celene Squibb, MD Primary Gastroenterologist: Elon Alas. Abbey Chatters, DO  Date:  10/20/2022  ID:  Keith Solis, DOB 04-20-1956, MRN [id number]   Chief Complaint   Chief Complaint  Patient presents with   Follow-up    Some acid reflux still. Medications does not seem to be working.     History of Present Illness  Keith Solis is a 67 y.o. male with a history of HTN with prior orthostatic hypotension and aortic dilation, GERD presenting today for post procedure follow up.   Colonoscopy 11/07/2017 with [facility]: Hemorrhoids on perianal exam Two 3-4 mm polyps in the ascending colon and cecum. Single 8 mm polyp in the descending colon   Pathology results: Cecal polyp -serrated, ascending colon polyp-hyperplastic, descending colon polyp- tubular adenoma  Last office visit 05/30/22. Dysphagia usually with cornbread, shredded wheat, pork skins.  Denies any issues with meats, liquids, or pills.  Denies frequent coughing.  Has occasional regurgitation of food.  Occasional belching and heartburn for which he takes Pepto or Rolaids.  Mostly symptoms during the day.  Denies eating spicy foods but does admit to fried foods giving him heartburn.  Occasional gas/bloating.  Had recent knee replacement.  Denied any lack of appetite, early satiety, unintentional weight loss, nausea/vomiting, constipation, diarrhea.  Scheduled for EGD with dilation and colonoscopy.  Advised famotidine as needed.  EGD 07/19/22: -3 cm hiatal hernia -moderate schatzki ring s/p dilation -gastritis s/p biopsy (negative H. Pylori) -normal duodenum -esophageal biopsies taken (benign) -Patient advised PPI twice daily  Colonoscopy 07/19/22: -non bleeding internal hemorrhoids -50m polyp in cecum (sessile serrated) -Repeat colonoscopy in 5 years  Today:  GERD: Reports he is still getting heartburn and spitting up with reflux.  Usually during the day. Happens a couple times per week. Has taken Nexium before in the past and had pretty good relief then. Does not think the breakthrough is related to certain foods. Refluxed up some food the other day. No burning. Feels like swallowing is improved since dilation. Does have some occasional upper epigastric pain as well. Does eat a lot of red meat and fairly often eats fried foods. Denies lack of appetite, early satiety, frequent NSAID use.   Stools have been a little hard and does have to strain a little at first. Generally has a BM everyday, very rarely does he skip a day.    Current Outpatient Medications  Medication Sig Dispense Refill   albuterol (VENTOLIN HFA) 108 (90 Base) MCG/ACT inhaler Inhale 2 puffs into the lungs every 6 (six) hours as needed for wheezing or shortness of breath.     amLODipine (NORVASC) 5 MG tablet Take 5 mg by mouth daily.     gabapentin (NEURONTIN) 300 MG capsule Take 300 mg by mouth 3 (three) times daily.     levothyroxine (SYNTHROID) 75 MCG tablet Take 75 mcg by mouth daily before breakfast.     Multiple Vitamin (MULTIVITAMIN WITH MINERALS) TABS tablet Take 1 tablet by mouth daily.     olmesartan (BENICAR) 40 MG tablet Take 1 tablet (40 mg total) by mouth daily. 30 tablet 11   pantoprazole (PROTONIX) 40 MG tablet Take 1 tablet (40 mg total) by mouth 2 (two) times daily. 60 tablet 11   testosterone cypionate (DEPOTESTOSTERONE CYPIONATE) 200 MG/ML injection Inject 200 mg into the muscle every 14 (fourteen) days.     acetaminophen (TYLENOL) 500 MG tablet  Take 1,000 mg by mouth every 6 (six) hours as needed for moderate pain. (Patient not taking: Reported on 10/20/2022)     No current facility-administered medications for this visit.    Past Medical History:  Diagnosis Date   Hypertension    Neuropathy     Past Surgical History:  Procedure Laterality Date   BALLOON DILATION N/A 07/19/2022   Procedure: BALLOON DILATION;  Surgeon: Eloise Harman, DO;  [facility];  Service: Endoscopy;  Laterality: N/A;   BIOPSY  07/19/2022   Procedure: BIOPSY;  Surgeon: Eloise Harman, DO;  [facility];  Service: Endoscopy;;   cholecystectomy N/A    COLONOSCOPY WITH PROPOFOL N/A 07/19/2022   Procedure: COLONOSCOPY WITH PROPOFOL;  Surgeon: Eloise Harman, DO;  Location: [facility];  Service: Endoscopy;  Laterality: N/A;   ESOPHAGOGASTRODUODENOSCOPY (EGD) WITH PROPOFOL N/A 07/19/2022   Procedure: ESOPHAGOGASTRODUODENOSCOPY (EGD) WITH PROPOFOL;  Surgeon: Eloise Harman, DO;  [facility]: [facility];  Service: Endoscopy;  Laterality: N/A;   INGUINAL HERNIA REPAIR     POLYPECTOMY  07/19/2022   Procedure: POLYPECTOMY;  Surgeon: Eloise Harman, DO;  Location: [facility];  Service: Endoscopy;;   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    Family History  Problem Relation Age of Onset   Colon polyps Mother    Colon polyps Father    Colon polyps Sister    Colon polyps Sister    Colon cancer Neg Hx     Allergies as of 10/20/2022   (No Known Allergies)    Social History   Socioeconomic History   Marital status: Married    Spouse name: Not on file   Number of children: Not on file   Years of education: Not on file   Highest education level: Not on file  Occupational History   Not on file  Tobacco Use   Smoking status: Former    Types: Cigarettes   Smokeless tobacco: Never  Vaping Use   Vaping Use: Never used  Substance and Sexual Activity   Alcohol use: Not Currently   Drug use: Never   Sexual activity: Yes  Other Topics Concern   Not on file  Social History Narrative   Not on file   Social Determinants of Health   Financial Resource Strain: Not on file  Food Insecurity: Not on file  Transportation Needs: Not on file  Physical Activity: Not on file  Stress: Not on file  Social Connections: Not on file     Review of Systems   Gen: Denies fever, chills, anorexia. Denies  fatigue, weakness, weight loss.  CV: Denies chest pain, palpitations, syncope, peripheral edema, and claudication. Resp: Denies dyspnea at rest, cough, wheezing, coughing up blood, and pleurisy. GI: See HPI Derm: Denies rash, itching, dry skin Psych: Denies depression, anxiety, memory loss, confusion. No homicidal or suicidal ideation.  Heme: Denies bruising, bleeding, and enlarged lymph nodes.   Physical Exam   BP 124/80 (BP Location: Left Arm, Patient Position: Sitting, Cuff Size: Large)   Pulse 67   Temp 97.9 F (36.6 C) (Temporal)   Ht 6' (1.829 m)   Wt 276 lb 12.8 oz (125.6 kg)   SpO2 95%   BMI 37.54 kg/m   General:   Alert and oriented. No distress noted. Pleasant and cooperative.  Head:  Normocephalic and atraumatic. Eyes:  Conjuctiva clear without scleral icterus. Mouth:  Oral mucosa pink and moist. Good dentition. No lesions. Lungs:  Clear to  auscultation bilaterally. No wheezes, rales, or rhonchi. No distress.  Heart:  S1, S2 present without murmurs appreciated.  Abdomen:  +BS, soft, non-tender and non-distended. No rebound or guarding. No HSM or masses noted. Rectal: deferred Msk:  Symmetrical without gross deformities. Normal posture. Extremities:  Without edema. Neurologic:  Alert and  oriented x4 Psych:  Alert and cooperative. Normal mood and affect.   Assessment  Keith Solis is a 67 y.o. male with a history of HTN with prior orthostatic hypotension and aortic dilation, and GERD presenting today for post procedure follow up.   GERD, dysphagia: GERD not well-controlled on pantoprazole 40 mg twice daily.  Recent EGD 07/19/2022 with gastritis, negative for H. pylori and mild Schatzki's ring s/p dilation.  Still having heartburn about twice per week with occasional regurgitation/spitting up of partially digested food.  Denies typical burning sensation and no nocturnal symptoms.  Not following GERD diet.  Dysphagia has improved since dilation, able to eat foods that  he was not previously able to.  Patient would like to trial switching to a different medication therefore we will trial omeprazole 40 mg twice daily as his insurance requires a step up approach and would likely not pay for Nexium 40 mg twice daily until trying omeprazole.  Patient's preference is Nexium given this is worked well for him in the past.  GERD diet/lifestyle modifications reinforced today.  Also advised he may use famotidine 20 mg as needed for breakthrough.  Constipation: Mild.  Occasional hard stools initially with bowel movements requiring some straining, then soft and no issue after.  Generally has a BM every day.  Advised daily fiber supplement with Benefiber 2-3 teaspoons daily.  Advised after few weeks if this does not help with that he may use a daily over-the-counter stool softener such as Colace.  History of colon polyps: Colonoscopy in 2019 with 3 polyps.  Most recent TCS 07/19/2022 with 1 cecal sessile serrated polyp and internal hemorrhoids.  Plan for repeat colonoscopy in 5 years (2028).   PLAN   Benefiber 2-3 teaspoons daily.  May use stool softener if needed Stop pantoprazole 40 mg twice daily.  Start omeprazole 40 mg BID  Famotidine 20 mg as needed.  GERD diet/lifestyle modifications Follow up 3-4 months    Venetia Night, MSN, FNP-BC, AGACNP-BC [facility]

## 2022-10-20 ENCOUNTER — Encounter: Payer: Medicare PPO | Attending: Family Medicine | Admitting: Nutrition

## 2022-10-20 ENCOUNTER — Encounter: Payer: Self-pay | Admitting: Gastroenterology

## 2022-10-20 ENCOUNTER — Ambulatory Visit: Payer: Medicare PPO | Admitting: Gastroenterology

## 2022-10-20 VITALS — Ht 72.0 in | Wt 276.0 lb

## 2022-10-20 VITALS — BP 124/80 | HR 67 | Temp 97.9°F | Ht 72.0 in | Wt 276.8 lb

## 2022-10-20 DIAGNOSIS — Z86010 Personal history of colonic polyps: Secondary | ICD-10-CM

## 2022-10-20 DIAGNOSIS — R1310 Dysphagia, unspecified: Secondary | ICD-10-CM

## 2022-10-20 DIAGNOSIS — I10 Essential (primary) hypertension: Secondary | ICD-10-CM | POA: Diagnosis not present

## 2022-10-20 DIAGNOSIS — E118 Type 2 diabetes mellitus with unspecified complications: Secondary | ICD-10-CM | POA: Diagnosis not present

## 2022-10-20 DIAGNOSIS — K5900 Constipation, unspecified: Secondary | ICD-10-CM | POA: Diagnosis not present

## 2022-10-20 DIAGNOSIS — N529 Male erectile dysfunction, unspecified: Secondary | ICD-10-CM | POA: Diagnosis not present

## 2022-10-20 DIAGNOSIS — F5221 Male erectile disorder: Secondary | ICD-10-CM | POA: Diagnosis not present

## 2022-10-20 DIAGNOSIS — K219 Gastro-esophageal reflux disease without esophagitis: Secondary | ICD-10-CM | POA: Diagnosis not present

## 2022-10-20 MED ORDER — OMEPRAZOLE 40 MG PO CPDR: 40.0000 mg | DELAYED_RELEASE_CAPSULE | Freq: Two times a day (BID) | ORAL | 0 refills | Status: DC

## 2022-10-20 NOTE — Progress Notes (Signed)
Medical Nutrition Therapy  Appointment Start time:  0037  Appointment End time:  1630  Primary concerns today: E11.8  Referral diagnosis: E11.8  Preferred learning style: No preference. Learning readiness: Ready    NUTRITION ASSESSMENT  67 yr old bmale just recently diagnosed with Type 2 DM.A1C 6.5% No medications  yet. He wants to start with lifestyle medicine and work on weight loss. He is here with his wife. PMH: Type 2 DM, Obesity, HTN, GERD, ED, Neuropathy.  Shares his wife's meter to test his blood sugars. He note they have been less than 130 in am. He admits to eating 2-3 meals per day and snacking at night and randomly. Not doing any exercise due to neuropathy but willing to start doing some chair exercises or other non weigh bearing exercises for  needed weight loss.  Will need his own machine to test his blood sugars. Advised to contact PCP. Anthropometrics  Wt Readings from Last 3 Encounters:  10/20/22 276 lb (125.2 kg)  10/20/22 276 lb 12.8 oz (125.6 kg)  09/29/22 272 lb 9.6 oz (123.7 kg)   Ht Readings from Last 3 Encounters:  10/20/22 6' (1.829 m)  10/20/22 6' (1.829 m)  09/29/22 6' (1.829 m)   Body mass index is 37.43 kg/m. '@BMIFA'$ @ Facility age limit for growth %iles is 20 years. Facility age limit for growth %iles is 20 years.    Clinical Medical Hx: See chart Medications: See chart. No DM medications for now. Labs: A1C 6.5% reported.  Goals   None   No results found for: "HGBA1C"  Notable Signs/Symptoms: None  Lifestyle & Dietary Hx Retired. He lives with his wife. Wife and he shops and cooks.   Estimated daily fluid intake: 24 oz Supplements:  Sleep: 4-6 hrs Stress / self-care:  Current average weekly physical activity: ADL  24-Hr Dietary Recall First Meal: 9 am  2 boiled eggs,. Blueberry bagels, and juice.  8 oz Snack:  Second Meal skips or nabs Dinner: [place] ham with cheese on  rye bread, potato chips,  Diet sprite Snack:  protein bar Snack: chips, milk, animal crackers Beverages: water,   Estimated Energy Needs Calories: 1800 Carbohydrate: 200g Protein: 135g Fat: 50g   NUTRITION DIAGNOSIS  NB-1.1 Food and nutrition-related knowledge deficit As related to Diabetes Type 2 and Obesity.  As evidenced by A1C 6.5% and BMI > 30.   NUTRITION INTERVENTION  Nutrition education (E-1) on the following topics:  Nutrition and Diabetes education provided on My Plate, CHO counting, meal planning, portion sizes, timing of meals, avoiding snacks between meals unless having a low blood sugar, target ranges for A1C and blood sugars, signs/symptoms and treatment of hyper/hypoglycemia, monitoring blood sugars, taking medications as prescribed, benefits of exercising 30 minutes per day and prevention of complications of DM.  Lifestyle Medicine  - Whole Food, Plant Predominant Nutrition is highly recommended: Eat Plenty of vegetables, Mushrooms, fruits, Legumes, Whole Grains, Nuts, seeds in lieu of processed meats, processed snacks/pastries red meat, poultry, eggs.    -It is better to avoid simple carbohydrates including: Cakes, Sweet Desserts, Ice Cream, Soda (diet and regular), Sweet Tea, Candies, Chips, Cookies, Store Bought Juices, Alcohol in Excess of  1-2 drinks a day, Lemonade,  Artificial Sweeteners, Doughnuts, Coffee Creamers, "Sugar-free" Products, etc, etc.  This is not a complete list.....  Exercise: If you are able: 30 -60 minutes a day ,4 days a week, or 150 minutes a week.  The longer the better.  Combine stretch, strength, and aerobic  activities.  If you were told in the past that you have high risk for cardiovascular diseases, you may seek evaluation by your heart doctor prior to initiating moderate to intense exercise programs.   Handouts Provided Include  Dm Handouts Lifestyle Medicine  Learning Style & Readiness for Change Teaching method utilized: Visual & Auditory  Demonstrated degree of understanding  via: Teach Back  Barriers to learning/adherence to lifestyle change:   Goals Established by Pt Eat three meals on time as dicussed. Avoid snacks between meals Don't eat after 7 pm Get MD to order  your own meter and test twice a day; am and bedtime Goals BS less than 130 mg/dl in am and less than 150 mg/dl at bedtime. Drink only water Increase whole plant based foods Cut out junk food and processed foods. Lose 1-2 lbs per week.    MONITORING & EVALUATION Dietary intake, weekly physical activity, and blood sugars in 1 month..  Next Steps  Patient is to work on meal prepping and meal planning and exercise.Marland Kitchen

## 2022-10-20 NOTE — Patient Instructions (Addendum)
Stop pantoprazole 40 mg.  I am sending in omeprazole 40 mg to take twice daily, 30 minutes prior to breakfast and dinner.  Follow a GERD diet:  Avoid fried, fatty, greasy, spicy, citrus foods. Avoid caffeine and carbonated beverages. Avoid chocolate. Try eating 4-6 small meals a day rather than 3 large meals. Do not eat within 3 hours of laying down. Prop head of bed up on wood or bricks to create a 6 inch incline. Recommend 1-2 pound weight loss weekly until reaching ideal body weight.  You may use famotidine 20 mg as needed for breakthrough symptoms.  If you begin to develop any worsening epigastric pain, nausea, vomiting please reach out sooner.  Please begin Benefiber 2-3 teaspoons daily.  This should assist in easier bowel movements.  If you continue to have hard stools after a few weeks you may start a daily stool softener such as Colace (docusate sodium).  It was a pleasure to see you today. I want to create trusting relationships with patients. If you receive a survey regarding your visit,  I greatly appreciate you taking time to fill this out on paper or through your MyChart. I value your feedback.  Venetia Night, MSN, FNP-BC, AGACNP-BC [facility]

## 2022-10-25 ENCOUNTER — Encounter: Payer: Self-pay | Admitting: Nutrition

## 2022-10-25 NOTE — Patient Instructions (Signed)
  Goals Established by Pt Eat three meals on time as dicussed. Avoid snacks between meals Don't eat after 7 pm Get MD to order  your own meter and test twice a day; am and bedtime Goals BS less than 130 mg/dl in am and less than 150 mg/dl at bedtime. Drink only water Increase whole plant based foods Cut out junk food and processed foods. Lose 1-2 lbs per week.

## 2022-11-08 DIAGNOSIS — F5221 Male erectile disorder: Secondary | ICD-10-CM | POA: Diagnosis not present

## 2022-11-11 DIAGNOSIS — I10 Essential (primary) hypertension: Secondary | ICD-10-CM | POA: Diagnosis not present

## 2022-11-11 DIAGNOSIS — E039 Hypothyroidism, unspecified: Secondary | ICD-10-CM | POA: Diagnosis not present

## 2022-11-11 DIAGNOSIS — E1165 Type 2 diabetes mellitus with hyperglycemia: Secondary | ICD-10-CM | POA: Diagnosis not present

## 2022-11-14 ENCOUNTER — Ambulatory Visit: Payer: Medicare PPO | Admitting: Internal Medicine

## 2022-11-17 DIAGNOSIS — M25562 Pain in left knee: Secondary | ICD-10-CM | POA: Diagnosis not present

## 2022-11-17 DIAGNOSIS — E291 Testicular hypofunction: Secondary | ICD-10-CM | POA: Diagnosis not present

## 2022-11-17 DIAGNOSIS — E1140 Type 2 diabetes mellitus with diabetic neuropathy, unspecified: Secondary | ICD-10-CM | POA: Diagnosis not present

## 2022-11-17 DIAGNOSIS — R0683 Snoring: Secondary | ICD-10-CM | POA: Diagnosis not present

## 2022-11-17 DIAGNOSIS — E1165 Type 2 diabetes mellitus with hyperglycemia: Secondary | ICD-10-CM | POA: Diagnosis not present

## 2022-11-17 DIAGNOSIS — I10 Essential (primary) hypertension: Secondary | ICD-10-CM | POA: Diagnosis not present

## 2022-11-17 DIAGNOSIS — E039 Hypothyroidism, unspecified: Secondary | ICD-10-CM | POA: Diagnosis not present

## 2022-11-17 DIAGNOSIS — Z0001 Encounter for general adult medical examination with abnormal findings: Secondary | ICD-10-CM | POA: Diagnosis not present

## 2022-11-17 DIAGNOSIS — J309 Allergic rhinitis, unspecified: Secondary | ICD-10-CM | POA: Diagnosis not present

## 2022-11-24 DIAGNOSIS — E291 Testicular hypofunction: Secondary | ICD-10-CM | POA: Diagnosis not present

## 2022-12-09 DIAGNOSIS — E291 Testicular hypofunction: Secondary | ICD-10-CM | POA: Diagnosis not present

## 2022-12-09 IMAGING — CR DG CERVICAL SPINE COMPLETE 4+V
6 series · 6 of 6 positions shown · non-contrast
Comparison: None.

CLINICAL DATA: Neck pain.

EXAM:
CERVICAL SPINE - COMPLETE 4+ VIEW

[w c spine lat]
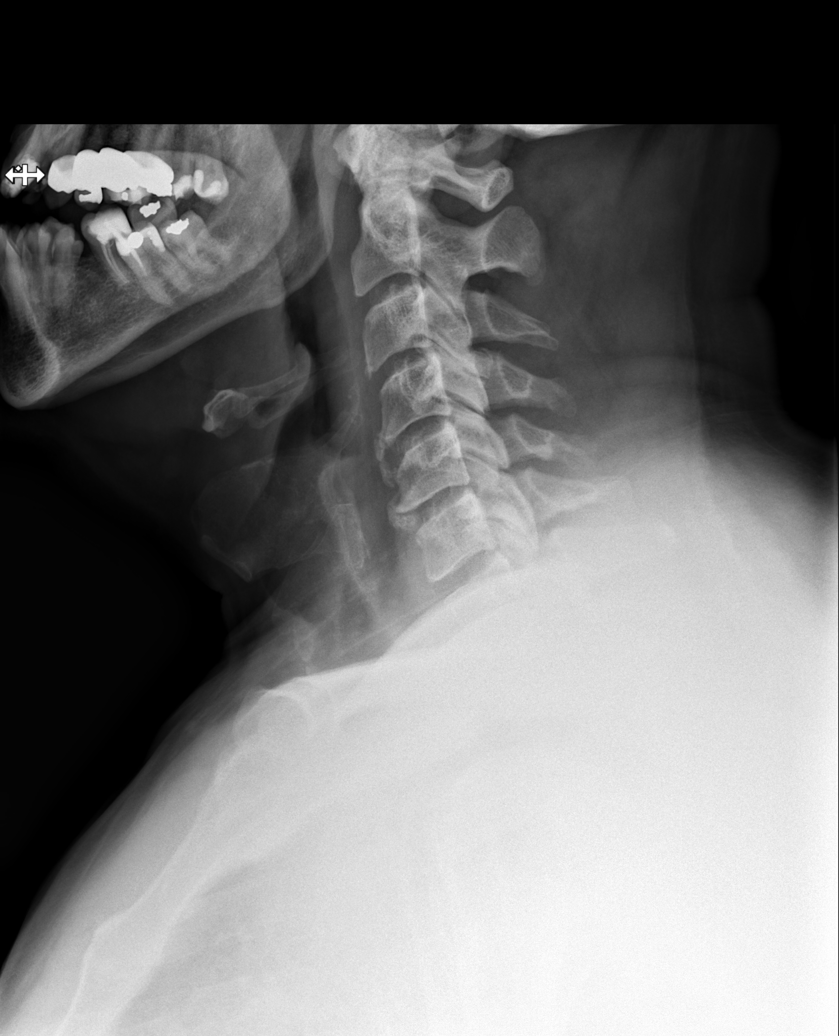

[w rpo (1 of 2)]
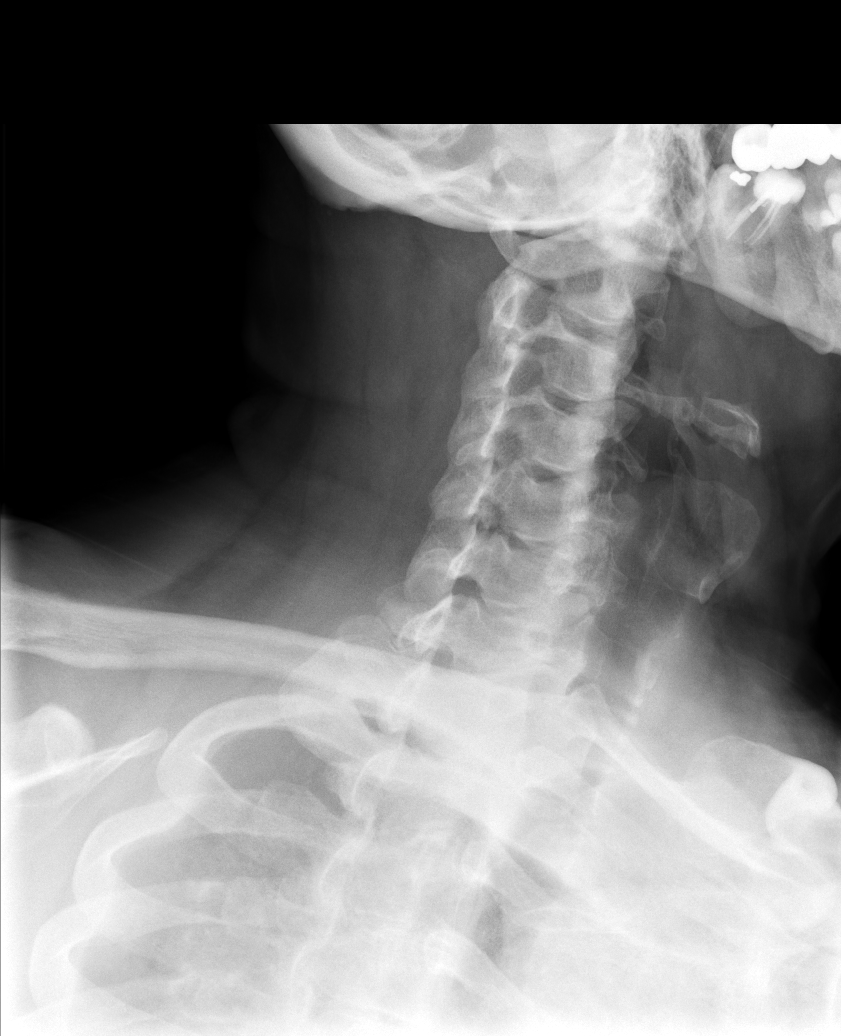

[w rpo (2 of 2)]
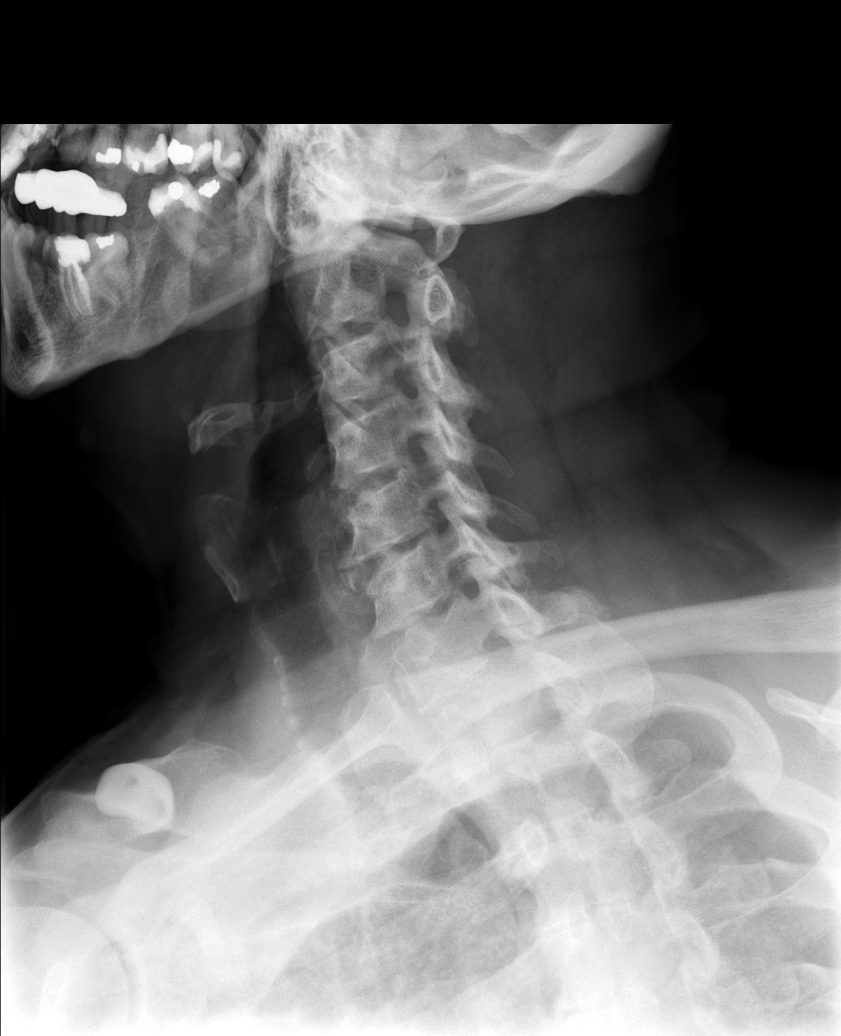

[w ap axial]
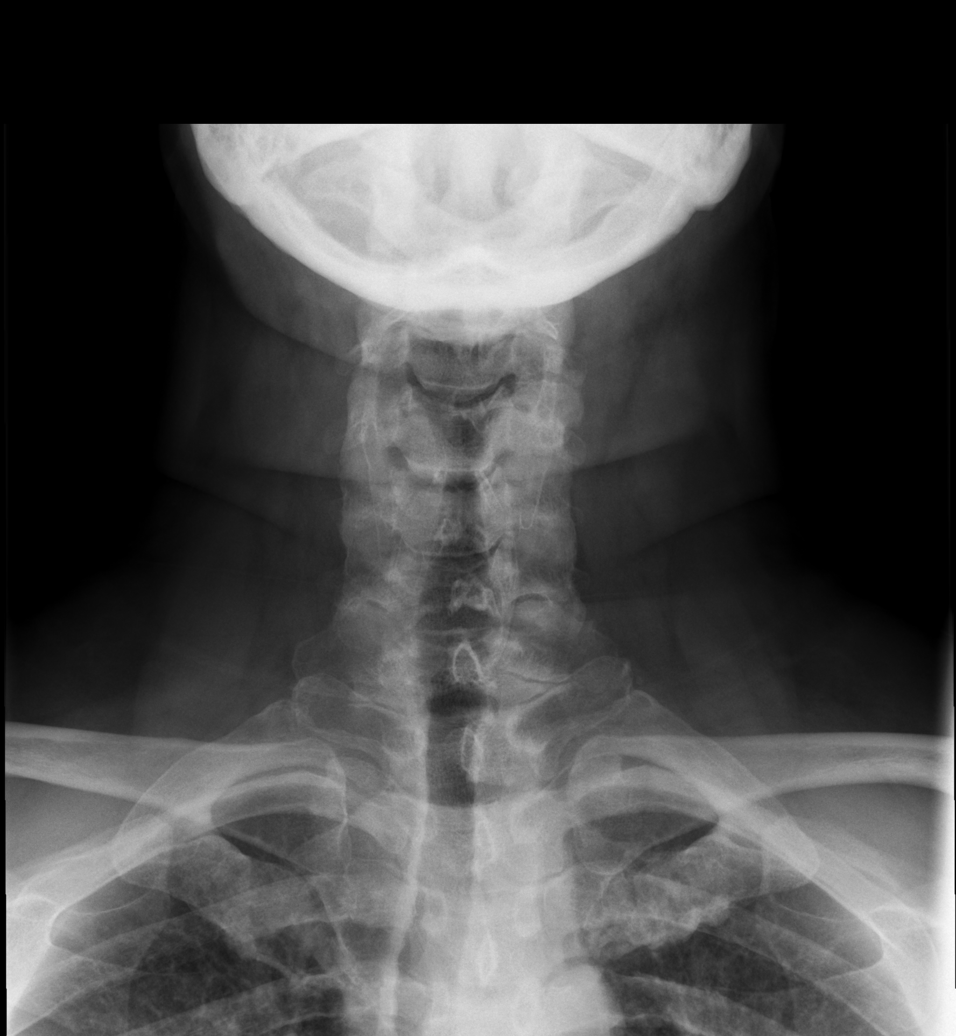

[w open mouth odontoid]
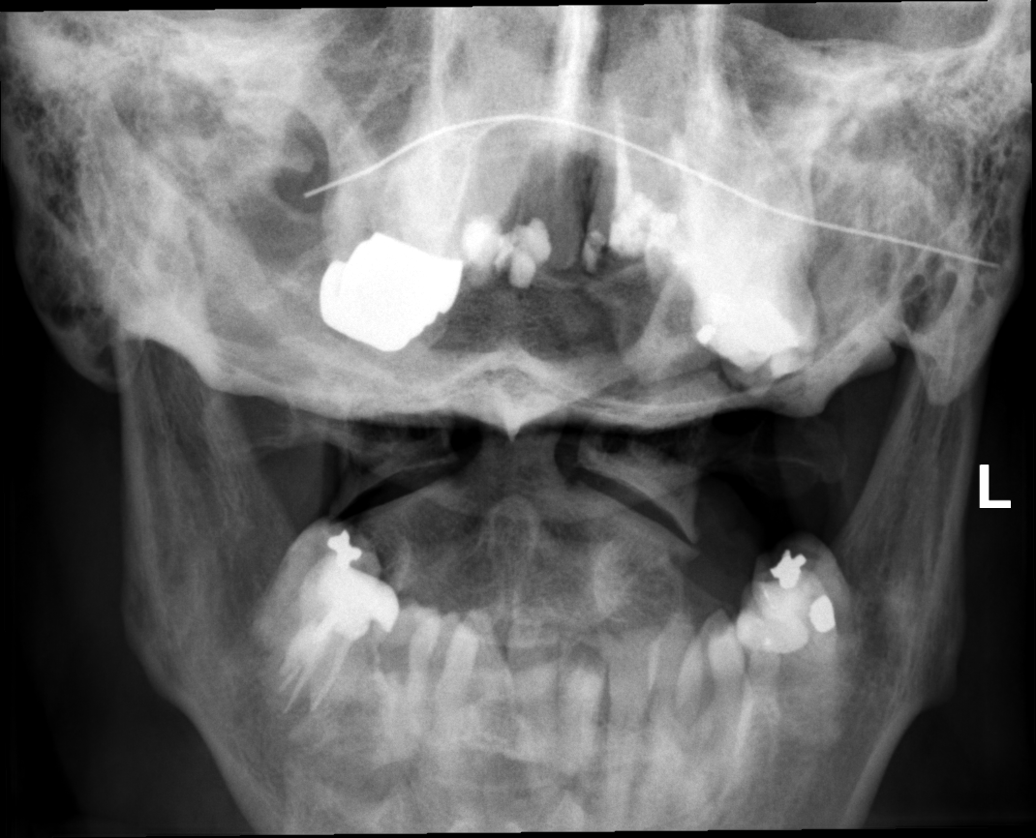

[w swimmers view]
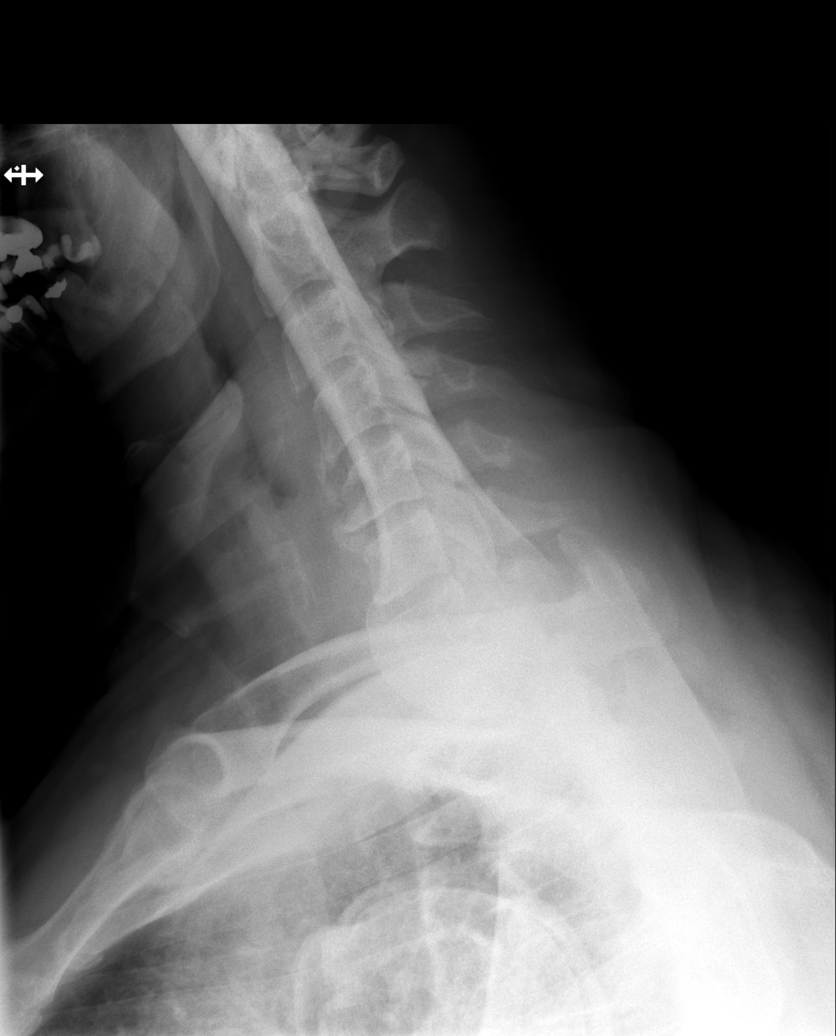

[6 of 6 positions shown; findings below may reference images not displayed]

FINDINGS: There is no acute fracture or subluxation of the cervical spine.
Multilevel degenerative changes with anterior osteophyte primarily
at C4-C5 and C5-C6. The visualized posterior elements and odontoid
appear intact. There is anatomic alignment of the lateral masses of
C1 and C2. The soft tissues are unremarkable.
IMPRESSION: No acute/traumatic cervical spine pathology.

## 2022-12-22 DIAGNOSIS — E291 Testicular hypofunction: Secondary | ICD-10-CM | POA: Diagnosis not present

## 2022-12-31 ENCOUNTER — Encounter (HOSPITAL_COMMUNITY): Payer: Self-pay

## 2022-12-31 ENCOUNTER — Encounter (HOSPITAL_COMMUNITY): Payer: Self-pay | Admitting: *Deleted

## 2022-12-31 ENCOUNTER — Other Ambulatory Visit: Payer: Self-pay

## 2022-12-31 ENCOUNTER — Emergency Department (HOSPITAL_COMMUNITY): Payer: Medicare PPO

## 2022-12-31 ENCOUNTER — Emergency Department (HOSPITAL_COMMUNITY)
Admission: EM | Admit: 2022-12-31 | Discharge: 2022-12-31 | Disposition: A | Discharge status: Home / Self Care | Payer: Medicare PPO | Attending: Emergency Medicine | Admitting: Emergency Medicine

## 2022-12-31 DIAGNOSIS — Z79899 Other long term (current) drug therapy: Secondary | ICD-10-CM | POA: Insufficient documentation

## 2022-12-31 DIAGNOSIS — R079 Chest pain, unspecified: Secondary | ICD-10-CM | POA: Diagnosis not present

## 2022-12-31 DIAGNOSIS — E039 Hypothyroidism, unspecified: Secondary | ICD-10-CM | POA: Diagnosis not present

## 2022-12-31 DIAGNOSIS — I10 Essential (primary) hypertension: Secondary | ICD-10-CM | POA: Insufficient documentation

## 2022-12-31 DIAGNOSIS — I200 Unstable angina: Secondary | ICD-10-CM | POA: Insufficient documentation

## 2022-12-31 DIAGNOSIS — E279 Disorder of adrenal gland, unspecified: Secondary | ICD-10-CM | POA: Diagnosis not present

## 2022-12-31 LAB — TROPONIN I (HIGH SENSITIVITY)
Troponin I (High Sensitivity): 8 ng/L (ref <18)
Troponin I (High Sensitivity): 8 ng/L (ref <18)

## 2022-12-31 LAB — CBC
HCT: 47.4 % (ref 39.0–52.0)
Hemoglobin: 15.7 g/dL (ref 13.0–17.0)
MCH: 28.9 pg (ref 26.0–34.0)
MCHC: 33.1 g/dL (ref 30.0–36.0)
MCV: 87.1 fL (ref 80.0–100.0)
Platelets: 233 10*3/uL (ref 150–400)
RBC: 5.44 MIL/uL (ref 4.22–5.81)
RDW: 13.8 % (ref 11.5–15.5)
WBC: 13.5 10*3/uL — ABNORMAL HIGH (ref 4.0–10.5)
nRBC: 0 % (ref 0.0–0.2)

## 2022-12-31 LAB — BASIC METABOLIC PANEL
Anion gap: 12 (ref 5–15)
BUN: 11 mg/dL (ref 8–23)
CO2: 26 mmol/L (ref 22–32)
Calcium: 8.7 mg/dL — ABNORMAL LOW (ref 8.9–10.3)
Chloride: 100 mmol/L (ref 98–111)
Creatinine, Ser: 1.19 mg/dL (ref 0.61–1.24)
GFR, Estimated: >60 mL/min (ref >60)
Glucose, Bld: 150 mg/dL — ABNORMAL HIGH (ref 70–99)
Potassium: 3.4 mmol/L — ABNORMAL LOW (ref 3.5–5.1)
Sodium: 138 mmol/L (ref 135–145)

## 2022-12-31 LAB — PROTIME-INR
INR: 1 (ref 0.8–1.2)
Prothrombin Time: 13.1 seconds (ref 11.4–15.2)

## 2022-12-31 LAB — CBG MONITORING, ED: Glucose-Capillary: 106 mg/dL — ABNORMAL HIGH (ref 70–99)

## 2022-12-31 MED ORDER — NITROGLYCERIN 0.4 MG SL SUBL: 0.4000 mg | SUBLINGUAL_TABLET | SUBLINGUAL | Status: DC | PRN

## 2022-12-31 MED ORDER — IOHEXOL 350 MG/ML SOLN
100.0000 mL | Freq: Once | INTRAVENOUS | Status: AC | PRN
  Administered 2022-12-31: 100 mL via INTRAVENOUS

## 2022-12-31 MED ORDER — SODIUM CHLORIDE 0.9 % IV SOLN: INTRAVENOUS | Status: DC

## 2022-12-31 MED ORDER — ASPIRIN 81 MG PO CHEW
324.0000 mg | CHEWABLE_TABLET | Freq: Once | ORAL | Status: AC
  Administered 2022-12-31: 324 mg via ORAL
  Filled 2022-12-31: qty 4

## 2022-12-31 NOTE — ED Triage Notes (Signed)
Pt with left sided CP that radiates down left arm that started this afternoon while weedeating.

## 2022-12-31 NOTE — ED Provider Notes (Signed)
[place] EMERGENCY DEPARTMENT AT [facility] Provider Note   CSN: [id number] Arrival date & time: 12/31/22  1506     History  Chief Complaint  Patient presents with   Chest Pain    Keith Solis is a 67 y.o. male.  HPI   This patient is a 67 year old male with a history of hypertension on amlodipine and olmesartan, also has a history of hypothyroidism.  He had evidently been seen by cardiology.  The patient had an ejection fraction of 55 to 60% on echocardiogram in July 2023, there was some aortic root dilation, mild measured at 40 mm, he has not followed up with cardiology since that time.  The patient reports that after the last week of having some increasing exertion he is noticed that today he was having chest discomfort going into his left arm and behind his shoulder blades.  This is a heavy and sharp sensation, he has had some associated mild swelling of his legs chronically but this is not worsened.  He has no increased shortness of breath though he does note some chronic shortness of breath related to drinking gasoline as a child.  He has no history of diabetes or high cholesterol, he does not take any anticoagulants or antiplatelets.  Chest pain is ongoing  Home Medications Prior to Admission medications   Medication Sig Start Date End Date Taking? Authorizing Provider  acetaminophen (TYLENOL) 500 MG tablet Take 1,000 mg by mouth every 6 (six) hours as needed for moderate pain. Patient not taking: Reported on 10/20/2022    [provider]  albuterol (VENTOLIN HFA) 108 (90 Base) MCG/ACT inhaler Inhale 2 puffs into the lungs every 6 (six) hours as needed for wheezing or shortness of breath.    [provider]  amLODipine (NORVASC) 5 MG tablet Take 5 mg by mouth daily.    [provider]  gabapentin (NEURONTIN) 300 MG capsule Take 300 mg by mouth 3 (three) times daily.    [provider]  levothyroxine (SYNTHROID) 75 MCG tablet  Take 75 mcg by mouth daily before breakfast.    [provider]  Multiple Vitamin (MULTIVITAMIN WITH MINERALS) TABS tablet Take 1 tablet by mouth daily.    [provider]  olmesartan (BENICAR) 40 MG tablet Take 1 tablet (40 mg total) by mouth daily. 09/29/22 09/29/23  Nyoka Cowden, MD  omeprazole (PRILOSEC) 40 MG capsule Take 1 capsule (40 mg total) by mouth 2 (two) times daily. 10/20/22 01/18/23  Aida Raider, NP  testosterone cypionate (DEPOTESTOSTERONE CYPIONATE) 200 MG/ML injection Inject 200 mg into the muscle every 14 (fourteen) days. 04/27/22   [provider]      Allergies    Patient has no known allergies.    Review of Systems   Review of Systems  All other systems reviewed and are negative.   Physical Exam Updated Vital Signs BP 138/73   Pulse 72   Resp 12   Ht 1.829 m (6')   Wt 122.5 kg   SpO2 96%   BMI 36.62 kg/m  Physical Exam Vitals and nursing note reviewed.  Constitutional:      General: He is not in acute distress.    Appearance: He is well-developed.  HENT:     Head: Normocephalic and atraumatic.     Mouth/Throat:     Pharynx: No oropharyngeal exudate.  Eyes:     General: No scleral icterus.       Right eye: No discharge.  Left eye: No discharge.     Conjunctiva/sclera: Conjunctivae normal.     Pupils: Pupils are equal, round, and reactive to light.  Neck:     Thyroid: No thyromegaly.     Vascular: No JVD.  Cardiovascular:     Rate and Rhythm: Normal rate and regular rhythm.     Heart sounds: Normal heart sounds. No murmur heard.    No friction rub. No gallop.  Pulmonary:     Effort: Pulmonary effort is normal. No respiratory distress.     Breath sounds: Normal breath sounds. No wheezing or rales.  Abdominal:     General: Bowel sounds are normal. There is no distension.     Palpations: Abdomen is soft. There is no mass.     Tenderness: There is no abdominal tenderness.  Musculoskeletal:        General: No  tenderness. Normal range of motion.     Cervical back: Normal range of motion and neck supple.     Right lower leg: Edema present.     Left lower leg: Edema present.  Lymphadenopathy:     Cervical: No cervical adenopathy.  Skin:    General: Skin is warm and dry.     Findings: No erythema or rash.  Neurological:     Mental Status: He is alert.     Coordination: Coordination normal.  Psychiatric:        Behavior: Behavior normal.     ED Results / Procedures / Treatments   Labs (all labs ordered are listed, but only abnormal results are displayed) Labs Reviewed  BASIC METABOLIC PANEL - Abnormal; Notable for the following components:      Result Value   Potassium 3.4 (*)    Glucose, Bld 150 (*)    Calcium 8.7 (*)    All other components within normal limits  CBC - Abnormal; Notable for the following components:   WBC 13.5 (*)    All other components within normal limits  CBG MONITORING, ED - Abnormal; Notable for the following components:   Glucose-Capillary 106 (*)    All other components within normal limits  PROTIME-INR  TROPONIN I (HIGH SENSITIVITY)  TROPONIN I (HIGH SENSITIVITY)    EKG EKG Interpretation  Date/Time:  Saturday December 31 2022 15:18:43 EDT Ventricular Rate:  76 PR Interval:  150 QRS Duration: 100 QT Interval:  364 QTC Calculation: 410 R Axis:   68 Text Interpretation: Sinus rhythm Abnormal inferior Q waves since last tracing no significant change Confirmed by Eber Hong ([id number]) on 12/31/2022 3:36:43 PM  Radiology CT Angio Chest/Abd/Pel for Dissection W and/or Wo Contrast  Result Date: 12/31/2022 CLINICAL DATA:  Acute aortic syndrome suspected. Left-sided chest pain radiating into the arm. EXAM: CT ANGIOGRAPHY CHEST, ABDOMEN AND PELVIS TECHNIQUE: Non-contrast CT of the chest was initially obtained. Multidetector CT imaging through the chest, abdomen and pelvis was performed using the standard protocol during bolus administration of intravenous  contrast. Multiplanar reconstructed images and MIPs were obtained and reviewed to evaluate the vascular anatomy. RADIATION DOSE REDUCTION: This exam was performed according to the departmental dose-optimization program which includes automated exposure control, adjustment of the mA and/or kV according to patient size and/or use of iterative reconstruction technique. CONTRAST:  OMNIPAQUE IOHEXOL 350 MG/ML SOLN COMPARISON:  None Available. FINDINGS: CTA CHEST FINDINGS Cardiovascular: Conventional 3 vessel arch anatomy. No evidence of aneurysm or dissection. No significant atherosclerotic plaque. The heart is normal in size. No pericardial effusion. Mediastinum/Nodes: Unremarkable CT appearance of the  thyroid gland. No suspicious mediastinal or hilar adenopathy. No soft tissue mediastinal mass. The thoracic esophagus is unremarkable. Lungs/Pleura: Lungs are clear. No pleural effusion or pneumothorax. Musculoskeletal: No acute fracture or aggressive appearing lytic or blastic osseous lesion. Review of the MIP images confirms the above findings. CTA ABDOMEN AND PELVIS FINDINGS VASCULAR Aorta: Normal caliber aorta without aneurysm, dissection, vasculitis or significant stenosis. Celiac: Patent without evidence of aneurysm, dissection, vasculitis or significant stenosis. SMA: Patent without evidence of aneurysm, dissection, vasculitis or significant stenosis. Renals: Both renal arteries are patent without evidence of aneurysm, dissection, vasculitis, fibromuscular dysplasia or significant stenosis. IMA: Patent without evidence of aneurysm, dissection, vasculitis or significant stenosis. Inflow: Patent without evidence of aneurysm, dissection, vasculitis or significant stenosis. Veins: No obvious venous abnormality within the limitations of this arterial phase study. Review of the MIP images confirms the above findings. NON-VASCULAR Hepatobiliary: No focal liver abnormality is seen. Status post cholecystectomy. No  biliary dilatation. Pancreas: Unremarkable. No pancreatic ductal dilatation or surrounding inflammatory changes. Spleen: No splenic injury or perisplenic hematoma. Adrenals/Urinary Tract: Small right adrenal nodule measures up to 1.3 cm. Small left adrenal nodule measures up to 1.4 cm. Neither meets attenuation criteria for simple adenoma on this single-phase study. Please note that both lesions are incompletely evaluated. In the absence of known metastatic disease, benign adenomas are strongly favored. No imaging follow-up recommended. No hydronephrosis, nephrolithiasis or enhancing renal mass. Ureters and bladder are unremarkable. Stomach/Bowel: Stomach is within normal limits. Appendix appears normal. No evidence of bowel wall thickening, distention, or inflammatory changes. Lymphatic: No suspicious lymphadenopathy. Reproductive: Mild prostatomegaly. Other: Surgical changes of prior laparoscopic right inguinal hernia repair. No free fluid. No recurrent hernia. Musculoskeletal: No acute fracture or aggressive appearing lytic or blastic osseous lesion. Focal L5-S1 degenerative disc disease. Review of the MIP images confirms the above findings. IMPRESSION: 1. Negative for acute aortic dissection, aneurysm or other acute vascular abnormality. 2. No acute cardiopulmonary process. 3. No acute abnormality within the abdomen or pelvis. 4. Prostatomegaly. 5. Surgical changes of prior laparoscopic right inguinal hernia repair. 6. L5-S1 degenerative disc disease. Electronically Signed   By: Malachy Moan M.D.   On: 12/31/2022 16:20   DG Chest Portable 1 View  Result Date: 12/31/2022 CLINICAL DATA:  Chest pain. EXAM: PORTABLE CHEST 1 VIEW COMPARISON:  Chest x-ray dated April 15, 2022. FINDINGS: The heart size and mediastinal contours are within normal limits. Both lungs are clear. The visualized skeletal structures are unremarkable. IMPRESSION: No active disease. Electronically Signed   By: Obie Dredge M.D.   On:  12/31/2022 15:49    Procedures Procedures    Medications Ordered in ED Medications  0.9 %  sodium chloride infusion (has no administration in time range)  nitroGLYCERIN (NITROSTAT) SL tablet 0.4 mg (has no administration in time range)  aspirin chewable tablet 324 mg (324 mg Oral Given 12/31/22 1617)  iohexol (OMNIPAQUE) 350 MG/ML injection 100 mL (100 mLs Intravenous Contrast Given 12/31/22 1603)    ED Course/ Medical Decision Making/ A&P                             Medical Decision Making Amount and/or Complexity of Data Reviewed Labs: ordered. Radiology: ordered.  Risk OTC drugs. Prescription drug management.   This patient presents to the ED for concern of chest discomfort rating it into the arm, this involves an extensive number of treatment options, and is a complaint that carries with it a  high risk of complications and morbidity.  The differential diagnosis includes acute coronary syndrome, aortic dissection, history of aortic root dilation, he is not hypertensive or tachycardic or hypoxic, this came on with exertion   Co morbidities that complicate the patient evaluation  Hypertension, he is overweight   Additional history obtained:  Additional history obtained from medical record External records from outside source obtained and reviewed including prior echocardiogram   Lab Tests:  I Ordered, and personally interpreted labs.  The pertinent results include: Glucose of 106, electrolytes are unremarkable, kidney function is better than it was in July of last year, troponin is 8, white blood cell count of 13,000   Imaging Studies ordered:  I ordered imaging studies including chest x-ray without acute findings, CT scan of the abdomen and pelvis and chest for dissection protocol were also ordered I independently visualized and interpreted imaging which showed no acute findings of dissection or pulmonary embolism or other acute findings causing the patient's  symptoms I agree with the radiologist interpretation   Cardiac Monitoring: / EKG:  The patient was maintained on a cardiac monitor.  I personally viewed and interpreted the cardiac monitored which showed an underlying rhythm of: Normal sinus rhythm   Consultations Obtained:  I requested consultation with the cardiologist Dr. Welton Flakes,  and discussed lab and imaging findings as well as pertinent plan - they recommend: Admission to the hospital for stress test   Problem List / ED Course / Critical interventions / Medication management  Chest pain, seems to have resolved at this point, however I am extremely concerned that this patient's symptoms could be related to unstable angina and coronary obstructive disease.  I discussed this with the patient at length but he has refused to be admitted to the hospital and states that he would rather see cardiology in the office now that he is chest pain-free.  Given that his EKG and troponin were negative I told the patient that the only test that would give him further information would be a provocative test and further blood work to rule out evolving MI with troponins, he understands that has his medical decision-making capacity and states that he wants to go home and follow-up in the office but will come back if symptoms worsen.  He understands that he is leaving against my advice but willing to come back should he need to.  I have asked him to start an aspirin a day, he is agreeable to this plan and was given cardiology follow-up information.  Social Determinants of Health:  Hypertension   Test / Admission - Considered:  Considered admission, patient refuses         Final Clinical Impression(s) / ED Diagnoses Final diagnoses:  Unstable angina    Rx / DC Orders ED Discharge Orders     None         Eber Hong, MD 12/31/22 1754

## 2022-12-31 NOTE — Discharge Instructions (Signed)
Your testing here has not shown any signs of a heart attack however you are at high risk for heart attack and blockages, I have recommended that you be admitted but you have refused and states that you want to go home.  You should return immediately if you develop any severe or worsening symptoms or if you change your mind.  We will take care of you, admit you to the hospital and perform a stress test which is what you need.  Since you are choosing to go home please see the phone number above for Dr. Diona Browner, either he or other physicians at the office can see you to follow-up, please call the office Monday morning, make an appointment, you must be seen this week.  Again please return to the ER if you change your mind or your symptoms return or worsen

## 2023-01-02 ENCOUNTER — Ambulatory Visit: Payer: Medicare PPO | Attending: Nurse Practitioner | Admitting: Nurse Practitioner

## 2023-01-02 ENCOUNTER — Encounter: Payer: Self-pay | Admitting: Nurse Practitioner

## 2023-01-02 VITALS — BP 130/84 | HR 67 | Ht 72.0 in | Wt 277.0 lb

## 2023-01-02 DIAGNOSIS — R0609 Other forms of dyspnea: Secondary | ICD-10-CM | POA: Diagnosis not present

## 2023-01-02 DIAGNOSIS — R0683 Snoring: Secondary | ICD-10-CM | POA: Diagnosis not present

## 2023-01-02 DIAGNOSIS — M7989 Other specified soft tissue disorders: Secondary | ICD-10-CM

## 2023-01-02 DIAGNOSIS — R079 Chest pain, unspecified: Secondary | ICD-10-CM | POA: Diagnosis not present

## 2023-01-02 DIAGNOSIS — E669 Obesity, unspecified: Secondary | ICD-10-CM | POA: Diagnosis not present

## 2023-01-02 DIAGNOSIS — I10 Essential (primary) hypertension: Secondary | ICD-10-CM

## 2023-01-02 DIAGNOSIS — G4719 Other hypersomnia: Secondary | ICD-10-CM | POA: Diagnosis not present

## 2023-01-02 NOTE — Patient Instructions (Addendum)
Medication Instructions:  Your physician recommends that you continue on your current medications as directed. Please refer to the Current Medication list given to you today.   Labwork: none  Testing/Procedures: Your physician has requested that you have an exercise tolerance test. For further information please visit [email or web address] Please also follow instruction sheet, as given.   Follow-Up:  Your physician recommends that you schedule a follow-up appointment in: 6-8 weeks  Any Other Special Instructions Will Be Listed Below (If Applicable).  If you need a refill on your cardiac medications before your next appointment, please call your pharmacy.

## 2023-01-02 NOTE — Progress Notes (Signed)
Office Visit    Patient Name: Keith Solis Date of Encounter: 01/02/2023  PCP:  Benita Stabile, MD   [facility]  Cardiologist:  Christell Constant, MD  Advanced Practice Provider:  No care team member to display Electrophysiologist:  None   Chief Complaint    ZEE VILL is a 67 y.o. male with a hx of HTN, orthostatic hypotension, neuropathy, former smoker, and hx of chest pain, who presents today for ED follow-up.   Past Medical History    Past Medical History:  Diagnosis Date   Hypertension    Neuropathy    Past Surgical History:  Procedure Laterality Date   BALLOON DILATION N/A 07/19/2022   Procedure: BALLOON DILATION;  Surgeon: Lanelle Bal, DO;  [facility];  Service: Endoscopy;  Laterality: N/A;   BIOPSY  07/19/2022   Procedure: BIOPSY;  Surgeon: Lanelle Bal, DO;  [facility];  Service: Endoscopy;;   cholecystectomy N/A    COLONOSCOPY WITH PROPOFOL N/A 07/19/2022   Procedure: COLONOSCOPY WITH PROPOFOL;  Surgeon: Lanelle Bal, DO;  [facility];  Service: Endoscopy;  Laterality: N/A;   ESOPHAGOGASTRODUODENOSCOPY (EGD) WITH PROPOFOL N/A 07/19/2022   Procedure: ESOPHAGOGASTRODUODENOSCOPY (EGD) WITH PROPOFOL;  Surgeon: Lanelle Bal, DO;  [facility];  Service: Endoscopy;  Laterality: N/A;   INGUINAL HERNIA REPAIR     POLYPECTOMY  07/19/2022   Procedure: POLYPECTOMY;  Surgeon: Lanelle Bal, DO;  [facility];  Service: Endoscopy;;   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 male with a PMH as mentioned above.   Had a prior episode of near syncope after pressure washing his house, took OTC supplement, and in AM work up with near syncope and hypotension. Echo showed normal EF. Aortic dilatation was noted mildly along aortic root at 40 mm.   Last seen by Dr.  Izora Ribas on 04/28/2022. Was doing well at the time. Recommended to repeat Echo in 1 year for aortic dilatation.  Most recently, presented to [facility] ED for chest discomfort with exertion, symptoms along left arm and behind shoulder blades. Noted mild swelling of legs, stable. No increased SHOB, noted chronic SHOB r/t drinking gasoline as child. Lab work overall unremarkable. CT of chest was negative for PE or aortic dissection or anything acute. Suggested/recommended to be admitted to hospital, pt refused. EKG and troponins were negative. Was told to follow-up with outpatient cardiology. Left AMA.   Today he presents for follow-up. He states he is doing well.  Has not had any recurrent chest discomfort since leaving the emergency department.  States it was recommended to him to have stress test performed outpatient.  Chronic, stable shortness of breath with exertion.  Denies any worsening shortness of breath, palpitations, syncope, presyncope, dizziness, orthopnea, PND, significant weight changes, acute bleeding, or claudication.  Stable, minimal lower extremity edema.  Wife presents concern for OSA during interview.  STOP-BANG score today 8.  EKGs/Labs/Other Studies Reviewed:   The following studies were reviewed today:   EKG:  EKG is not ordered today.    Echocardiogram 03/2022: 1. Left ventricular ejection fraction, by estimation, is 55 to 60%. The  left ventricle has normal function. Left ventricular endocardial border  not optimally defined to evaluate regional wall motion, though no seen in  views obtained (cannot see  anterolated  myocardial borders well). There is mild concentric left  ventricular hypertrophy. Left ventricular diastolic parameters are  indeterminate.   2. Right ventricular systolic function is normal. The right ventricular  size is normal. Tricuspid regurgitation signal is inadequate for assessing  PA pressure.   3. Left atrial size was moderately dilated.   4. The  mitral valve is normal in structure. No evidence of mitral valve  regurgitation. No evidence of mitral stenosis.   5. The aortic valve is tricuspid. Aortic valve regurgitation is not  visualized. No aortic stenosis is present.   6. Aortic dilatation noted. There is mild dilatation of the aortic root,  measuring 40 mm.   7. The inferior vena cava is normal in size with greater than 50%  respiratory variability, suggesting right atrial pressure of 3 mmHg.   Comparison(s): No prior Echocardiogram.  Recent Labs: 04/15/2022: ALT 17 12/31/2022: BUN 11; Creatinine, Ser 1.19; Hemoglobin 15.7; Platelets 233; Potassium 3.4; Sodium 138  Recent Lipid Panel No results found for: "CHOL", "TRIG", "HDL", "CHOLHDL", "VLDL", "LDLCALC", "LDLDIRECT"  Home Medications   Current Meds  Medication Sig   acetaminophen (TYLENOL) 500 MG tablet Take 1,000 mg by mouth every 6 (six) hours as needed for moderate pain.   albuterol (VENTOLIN HFA) 108 (90 Base) MCG/ACT inhaler Inhale 2 puffs into the lungs every 6 (six) hours as needed for wheezing or shortness of breath.   amLODipine (NORVASC) 5 MG tablet Take 5 mg by mouth daily.   gabapentin (NEURONTIN) 300 MG capsule Take 300 mg by mouth 3 (three) times daily.   levothyroxine (SYNTHROID) 75 MCG tablet Take 75 mcg by mouth daily before breakfast.   Multiple Vitamin (MULTIVITAMIN WITH MINERALS) TABS tablet Take 1 tablet by mouth daily.   olmesartan (BENICAR) 40 MG tablet Take 1 tablet (40 mg total) by mouth daily.   omeprazole (PRILOSEC) 40 MG capsule Take 1 capsule (40 mg total) by mouth 2 (two) times daily.   testosterone cypionate (DEPOTESTOSTERONE CYPIONATE) 200 MG/ML injection Inject 200 mg into the muscle every 14 (fourteen) days.     Review of Systems    All other systems reviewed and are otherwise negative except as noted above.  Physical Exam    VS:  BP 130/84 (BP Location: Right Arm, Patient Position: Sitting, Cuff Size: Large)   Pulse 67   Ht 6'  (1.829 m)   Wt 277 lb (125.6 kg)   SpO2 100%   BMI 37.57 kg/m  , BMI Body mass index is 37.57 kg/m.  Wt Readings from Last 3 Encounters:  01/02/23 277 lb (125.6 kg)  12/31/22 270 lb (122.5 kg)  10/20/22 276 lb (125.2 kg)     GEN: Obese, 67 year old male in no acute distress. HEENT: normal. Neck: Supple, no JVD, carotid bruits, or masses. Cardiac: S1/S2, RRR, no murmurs, rubs, or gallops. No clubbing, cyanosis.  Minimal, nonpitting edema along bilateral lower extremities.  Radials/PT 2+ and equal bilaterally.  Respiratory:  Respirations regular and unlabored, clear to auscultation bilaterally. MS: No deformity or atrophy. Skin: Warm and dry, no rash. Neuro:  Strength and sensation are intact. Psych: Normal affect, fatigued.  Assessment & Plan    Chest pain of uncertain etiology Noted chest discomfort going into the left arm behind shoulder blades and along left side of chest with some exertion, denied any recurrent chest pain after leaving AMA in the ED.  Discussed ischemic evaluation, including ETT with risk and benefits, and he verbalized understanding is agreeable to proceed.  Troponins and EKG  were unremarkable.  Continue current medication. Heart healthy diet and regular cardiovascular exercise encouraged.  ED precautions discussed.  Shared Decision Making/Informed Consent The risks [chest pain, shortness of breath, cardiac arrhythmias, dizziness, blood pressure fluctuations, myocardial infarction, stroke/transient ischemic attack, and life-threatening complications (estimated to be 1 in 10,000)], benefits (risk stratification, diagnosing coronary artery disease, treatment guidance) and alternatives of an exercise tolerance test were discussed in detail with Mr. Gilardi and he agrees to proceed.   2.  Hypertension Blood pressure stable. Discussed to monitor BP at home at least 2 hours after medications and sitting for 5-10 minutes.  Continue amlodipine and olmesartan. Heart  healthy diet and regular cardiovascular exercise encouraged.   3. OSA screen, daytime sleepiness, snoring, DOE Stable, chronic DOE - has seen Dr. Sherene Sires with pulmonology in the past. STOP-BANG score 8 today. Discussed/recommended to call pulmonology office for in person evaluation.  Will route note to Dr. Sherene Sires.  Recommend arranging split-night sleep study for OSA evaluation at next office visit.  5. Obesity, leg swelling BMI 37.57.  Minimal/trace nonpitting edema along BLE.  Recommended continue wearing compression stockings. Weight loss via diet and exercise encouraged. Discussed the impact being overweight would have on cardiovascular risk.  Disposition: Follow up in 6-8 week(s) with Christell Constant, MD or APP.  Signed, Sharlene Dory, NP 01/02/2023, 12:19 PM  [facility]

## 2023-01-04 ENCOUNTER — Telehealth: Payer: Self-pay

## 2023-01-04 ENCOUNTER — Other Ambulatory Visit: Payer: Self-pay | Admitting: Gastroenterology

## 2023-01-04 ENCOUNTER — Ambulatory Visit: Payer: Medicare PPO | Admitting: Nutrition

## 2023-01-04 DIAGNOSIS — K219 Gastro-esophageal reflux disease without esophagitis: Secondary | ICD-10-CM

## 2023-01-04 NOTE — Telephone Encounter (Signed)
     Patient  visit on 12/31/2022  at [facility] was for chest pain.  Have you been able to follow up with your primary care physician? Patient followed up with Cardiologist.  The patient was or was not able to obtain any needed medicine or equipment. No medication prescribed.  Are there diet recommendations that you are having difficulty following? No  Patient expresses understanding of discharge instructions and education provided has no other needs at this time. Yes   [name]  [facility] Community Resource Care Guide   ??[name]  ?? [phone]   Website: [email or web address]  [email or web address]

## 2023-01-05 DIAGNOSIS — F5221 Male erectile disorder: Secondary | ICD-10-CM | POA: Diagnosis not present

## 2023-01-05 DIAGNOSIS — E291 Testicular hypofunction: Secondary | ICD-10-CM | POA: Diagnosis not present

## 2023-01-09 ENCOUNTER — Other Ambulatory Visit: Payer: Self-pay

## 2023-01-09 ENCOUNTER — Observation Stay (HOSPITAL_COMMUNITY)
Admission: EM | Admit: 2023-01-09 | Discharge: 2023-01-11 | Disposition: A | Discharge status: Home / Self Care | Payer: Medicare PPO | Attending: Family Medicine | Admitting: Family Medicine

## 2023-01-09 ENCOUNTER — Encounter (HOSPITAL_COMMUNITY): Payer: Self-pay | Admitting: Emergency Medicine

## 2023-01-09 ENCOUNTER — Observation Stay (HOSPITAL_BASED_OUTPATIENT_CLINIC_OR_DEPARTMENT_OTHER): Payer: Medicare PPO

## 2023-01-09 ENCOUNTER — Other Ambulatory Visit (HOSPITAL_COMMUNITY): Payer: Self-pay | Admitting: *Deleted

## 2023-01-09 ENCOUNTER — Emergency Department (HOSPITAL_COMMUNITY): Payer: Medicare PPO

## 2023-01-09 DIAGNOSIS — Z6839 Body mass index (BMI) 39.0-39.9, adult: Secondary | ICD-10-CM | POA: Insufficient documentation

## 2023-01-09 DIAGNOSIS — I493 Ventricular premature depolarization: Secondary | ICD-10-CM

## 2023-01-09 DIAGNOSIS — Z79899 Other long term (current) drug therapy: Secondary | ICD-10-CM | POA: Diagnosis not present

## 2023-01-09 DIAGNOSIS — R079 Chest pain, unspecified: Secondary | ICD-10-CM | POA: Diagnosis not present

## 2023-01-09 DIAGNOSIS — E119 Type 2 diabetes mellitus without complications: Secondary | ICD-10-CM | POA: Diagnosis not present

## 2023-01-09 DIAGNOSIS — I200 Unstable angina: Secondary | ICD-10-CM

## 2023-01-09 DIAGNOSIS — R072 Precordial pain: Principal | ICD-10-CM | POA: Insufficient documentation

## 2023-01-09 DIAGNOSIS — R0602 Shortness of breath: Secondary | ICD-10-CM | POA: Diagnosis not present

## 2023-01-09 DIAGNOSIS — E039 Hypothyroidism, unspecified: Secondary | ICD-10-CM | POA: Diagnosis not present

## 2023-01-09 DIAGNOSIS — K219 Gastro-esophageal reflux disease without esophagitis: Secondary | ICD-10-CM | POA: Diagnosis not present

## 2023-01-09 DIAGNOSIS — E669 Obesity, unspecified: Secondary | ICD-10-CM | POA: Insufficient documentation

## 2023-01-09 DIAGNOSIS — D72829 Elevated white blood cell count, unspecified: Secondary | ICD-10-CM | POA: Insufficient documentation

## 2023-01-09 DIAGNOSIS — I10 Essential (primary) hypertension: Secondary | ICD-10-CM | POA: Insufficient documentation

## 2023-01-09 DIAGNOSIS — R0789 Other chest pain: Secondary | ICD-10-CM | POA: Diagnosis not present

## 2023-01-09 DIAGNOSIS — Z87891 Personal history of nicotine dependence: Secondary | ICD-10-CM | POA: Diagnosis not present

## 2023-01-09 DIAGNOSIS — J9811 Atelectasis: Secondary | ICD-10-CM | POA: Diagnosis not present

## 2023-01-09 HISTORY — DX: Gastritis, unspecified, without bleeding: K29.70

## 2023-01-09 HISTORY — DX: Type 2 diabetes mellitus without complications: E11.9

## 2023-01-09 HISTORY — DX: Diaphragmatic hernia without obstruction or gangrene: K44.9

## 2023-01-09 HISTORY — DX: Gastro-esophageal reflux disease without esophagitis: K21.9

## 2023-01-09 HISTORY — DX: Esophageal obstruction: K22.2

## 2023-01-09 HISTORY — DX: Obesity, unspecified: E66.9

## 2023-01-09 LAB — CBC
HCT: 51.6 % (ref 39.0–52.0)
Hemoglobin: 16.9 g/dL (ref 13.0–17.0)
MCH: 28.9 pg (ref 26.0–34.0)
MCHC: 32.8 g/dL (ref 30.0–36.0)
MCV: 88.2 fL (ref 80.0–100.0)
Platelets: 231 10*3/uL (ref 150–400)
RBC: 5.85 MIL/uL — ABNORMAL HIGH (ref 4.22–5.81)
RDW: 13.7 % (ref 11.5–15.5)
WBC: 14.4 10*3/uL — ABNORMAL HIGH (ref 4.0–10.5)
nRBC: 0 % (ref 0.0–0.2)

## 2023-01-09 LAB — HEMOGLOBIN A1C
Hgb A1c MFr Bld: 5.9 % — ABNORMAL HIGH (ref 4.8–5.6)
Mean Plasma Glucose: 122.63 mg/dL

## 2023-01-09 LAB — ECHOCARDIOGRAM COMPLETE
Area-P 1/2: 3.48 cm2
Height: 72 in
S' Lateral: 4.2 cm
Weight: 4444.47 oz

## 2023-01-09 LAB — PHOSPHORUS: Phosphorus: 2.6 mg/dL (ref 2.5–4.6)

## 2023-01-09 LAB — BASIC METABOLIC PANEL
Anion gap: 7 (ref 5–15)
BUN: 12 mg/dL (ref 8–23)
CO2: 30 mmol/L (ref 22–32)
Calcium: 8.9 mg/dL (ref 8.9–10.3)
Chloride: 102 mmol/L (ref 98–111)
Creatinine, Ser: 1.11 mg/dL (ref 0.61–1.24)
GFR, Estimated: >60 mL/min (ref >60)
Glucose, Bld: 113 mg/dL — ABNORMAL HIGH (ref 70–99)
Potassium: 4 mmol/L (ref 3.5–5.1)
Sodium: 139 mmol/L (ref 135–145)

## 2023-01-09 LAB — HEPATIC FUNCTION PANEL
ALT: 14 U/L (ref 0–44)
AST: 13 U/L — ABNORMAL LOW (ref 15–41)
Albumin: 3.8 g/dL (ref 3.5–5.0)
Alkaline Phosphatase: 61 U/L (ref 38–126)
Bilirubin, Direct: 0.3 mg/dL — ABNORMAL HIGH (ref 0.0–0.2)
Indirect Bilirubin: 1 mg/dL — ABNORMAL HIGH (ref 0.3–0.9)
Total Bilirubin: 1.3 mg/dL — ABNORMAL HIGH (ref 0.3–1.2)
Total Protein: 6.9 g/dL (ref 6.5–8.1)

## 2023-01-09 LAB — TROPONIN I (HIGH SENSITIVITY)
Troponin I (High Sensitivity): 8 ng/L (ref <18)
Troponin I (High Sensitivity): 7 ng/L (ref <18)
Troponin I (High Sensitivity): 6 ng/L (ref <18)
Troponin I (High Sensitivity): 5 ng/L (ref <18)

## 2023-01-09 LAB — HIV ANTIBODY (ROUTINE TESTING W REFLEX): HIV Screen 4th Generation wRfx: NONREACTIVE

## 2023-01-09 LAB — CBG MONITORING, ED
Glucose-Capillary: 114 mg/dL — ABNORMAL HIGH (ref 70–99)
Glucose-Capillary: 118 mg/dL — ABNORMAL HIGH (ref 70–99)
Glucose-Capillary: 142 mg/dL — ABNORMAL HIGH (ref 70–99)

## 2023-01-09 LAB — MAGNESIUM: Magnesium: 1.9 mg/dL (ref 1.7–2.4)

## 2023-01-09 LAB — TSH: TSH: 5.999 u[IU]/mL — ABNORMAL HIGH (ref 0.350–4.500)

## 2023-01-09 LAB — T4, FREE: Free T4: 0.98 ng/dL (ref 0.61–1.12)

## 2023-01-09 MED ORDER — ACETAMINOPHEN 650 MG RE SUPP: 650.0000 mg | Freq: Four times a day (QID) | RECTAL | Status: DC | PRN

## 2023-01-09 MED ORDER — ONDANSETRON HCL 4 MG/2ML IJ SOLN: 4.0000 mg | Freq: Four times a day (QID) | INTRAMUSCULAR | Status: DC | PRN

## 2023-01-09 MED ORDER — ASPIRIN 81 MG PO CHEW
324.0000 mg | CHEWABLE_TABLET | Freq: Once | ORAL | Status: AC
  Administered 2023-01-09: 324 mg via ORAL
  Filled 2023-01-09: qty 4

## 2023-01-09 MED ORDER — ONDANSETRON HCL 4 MG PO TABS: 4.0000 mg | ORAL_TABLET | Freq: Four times a day (QID) | ORAL | Status: DC | PRN

## 2023-01-09 MED ORDER — PANTOPRAZOLE SODIUM 40 MG PO TBEC
80.0000 mg | DELAYED_RELEASE_TABLET | Freq: Every day | ORAL | Status: DC
  Administered 2023-01-09 – 2023-01-11 (×3): 80 mg via ORAL
  Filled 2023-01-09 (×3): qty 2

## 2023-01-09 MED ORDER — INSULIN ASPART 100 UNIT/ML IJ SOLN: 0.0000 [IU] | Freq: Three times a day (TID) | INTRAMUSCULAR | Status: DC

## 2023-01-09 MED ORDER — IRBESARTAN 150 MG PO TABS
300.0000 mg | ORAL_TABLET | Freq: Every day | ORAL | Status: DC
  Administered 2023-01-09 – 2023-01-11 (×3): 300 mg via ORAL
  Filled 2023-01-09 (×3): qty 2

## 2023-01-09 MED ORDER — ASPIRIN 81 MG PO TBEC: 81.0000 mg | DELAYED_RELEASE_TABLET | Freq: Every day | ORAL | Status: DC

## 2023-01-09 MED ORDER — ISOSORBIDE MONONITRATE ER 30 MG PO TB24
30.0000 mg | ORAL_TABLET | Freq: Every day | ORAL | Status: DC
  Administered 2023-01-09 – 2023-01-11 (×3): 30 mg via ORAL
  Filled 2023-01-09 (×3): qty 1

## 2023-01-09 MED ORDER — LEVOTHYROXINE SODIUM 75 MCG PO TABS
75.0000 ug | ORAL_TABLET | Freq: Every day | ORAL | Status: DC
  Administered 2023-01-10 – 2023-01-11 (×2): 75 ug via ORAL
  Filled 2023-01-09: qty 2
  Filled 2023-01-09 (×2): qty 1

## 2023-01-09 MED ORDER — MAGNESIUM SULFATE IN D5W 1-5 GM/100ML-% IV SOLN
1.0000 g | Freq: Once | INTRAVENOUS | Status: AC
  Administered 2023-01-09: 1 g via INTRAVENOUS
  Filled 2023-01-09: qty 100

## 2023-01-09 MED ORDER — ACETAMINOPHEN 325 MG PO TABS
650.0000 mg | ORAL_TABLET | Freq: Four times a day (QID) | ORAL | Status: DC | PRN
  Administered 2023-01-10: 650 mg via ORAL
  Filled 2023-01-09: qty 2

## 2023-01-09 MED ORDER — AMLODIPINE BESYLATE 5 MG PO TABS
5.0000 mg | ORAL_TABLET | Freq: Every day | ORAL | Status: DC
  Administered 2023-01-09 – 2023-01-11 (×3): 5 mg via ORAL
  Filled 2023-01-09 (×3): qty 1

## 2023-01-09 MED ORDER — LEVOTHYROXINE SODIUM 50 MCG PO TABS: 75.0000 ug | ORAL_TABLET | Freq: Every day | ORAL | Status: DC

## 2023-01-09 MED ORDER — NITROGLYCERIN 0.4 MG SL SUBL
0.4000 mg | SUBLINGUAL_TABLET | SUBLINGUAL | Status: DC | PRN
  Administered 2023-01-09 (×5): 0.4 mg via SUBLINGUAL
  Filled 2023-01-09 (×2): qty 1

## 2023-01-09 MED ORDER — ASPIRIN 81 MG PO TBEC
81.0000 mg | DELAYED_RELEASE_TABLET | Freq: Every day | ORAL | Status: DC
  Administered 2023-01-10 – 2023-01-11 (×2): 81 mg via ORAL
  Filled 2023-01-09 (×2): qty 1

## 2023-01-09 MED ORDER — ENOXAPARIN SODIUM 40 MG/0.4ML IJ SOSY
40.0000 mg | PREFILLED_SYRINGE | INTRAMUSCULAR | Status: DC
  Administered 2023-01-09 – 2023-01-11 (×3): 40 mg via SUBCUTANEOUS
  Filled 2023-01-09 (×3): qty 0.4

## 2023-01-09 NOTE — ED Notes (Signed)
Ambulated pt to use restroom

## 2023-01-09 NOTE — H&P (Signed)
History and Physical    Patient: Keith Solis ZOX:[id number] DOB: August 24, 1956 DOA: 01/09/2023 DOS: the patient was seen and examined on 01/09/2023 PCP: Benita Stabile, MD  Patient coming from: Home  Chief Complaint:  Chief Complaint  Patient presents with   Chest Pain   HPI: Keith Solis is a 67 y.o. male with medical history significant of hypertension, hypothyroidism, GERD who presents to the emergency department due to sudden onset of left-sided chest pain which started around 8 PM yesterday while at rest.  He took 2 baby aspirin with some improvement, but pain returned while trying to sleep, chest pain was on the left parasternal, it was nonreproducible and radiates to the jaw and between shoulder blades, so he decided to go to the ED for further evaluation and management.  He endorsed family history of cardiac disease from both paternal and maternal origin. Patient was seen in the ED on 4/13 due to similar presentation, troponin, EKG and CT angiogram of chest done at that time was negative, patient refused admission at that time, but he followed up with cardiologist 2 days later (4/15) and was scheduled to have an outpatient stress test on Wednesday (4/24).  ED Course:  In the emergency department, BP was 143/84 and other vital signs were within normal range.  Workup in the ED showed normal CBC except for WBC of 14.4 and normal BMP except for blood glucose of 113, troponin x 1 was 8. Chest x-ray showed stable exam without acute cardiopulmonary disease He was treated with aspirin 324 mg x 1 and 2 sublingual nitroglycerin with improvement in chest pain to 6/10 on pain scale.  Hospitalist was asked to admit patient for further evaluation and management.  Review of Systems: Review of systems as noted in the HPI. All other systems reviewed and are negative.   Past Medical History:  Diagnosis Date   GERD (gastroesophageal reflux disease)    Hypertension    Neuropathy    Past Surgical  History:  Procedure Laterality Date   BALLOON DILATION N/A 07/19/2022   Procedure: BALLOON DILATION;  Surgeon: Lanelle Bal, DO;  [facility];  Service: Endoscopy;  Laterality: N/A;   BIOPSY  07/19/2022   Procedure: BIOPSY;  Surgeon: Lanelle Bal, DO;  [facility];  Service: Endoscopy;;   cholecystectomy N/A    COLONOSCOPY WITH PROPOFOL N/A 07/19/2022   Procedure: COLONOSCOPY WITH PROPOFOL;  Surgeon: Lanelle Bal, DO;  [facility];  Service: Endoscopy;  Laterality: N/A;   ESOPHAGOGASTRODUODENOSCOPY (EGD) WITH PROPOFOL N/A 07/19/2022   Procedure: ESOPHAGOGASTRODUODENOSCOPY (EGD) WITH PROPOFOL;  Surgeon: Lanelle Bal, DO;  [facility];  Service: Endoscopy;  Laterality: N/A;   INGUINAL HERNIA REPAIR     POLYPECTOMY  07/19/2022   Procedure: POLYPECTOMY;  Surgeon: Lanelle Bal, DO;  [facility];  Service: Endoscopy;;   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 His smoking use included cigarettes. He has never been exposed to tobacco smoke. He has never used smokeless tobacco. He reports that he does not currently use alcohol. He reports that he does not use drugs.   No Known Allergies  Family History  Problem Relation Age of Onset   Colon polyps Mother    Colon polyps Father    Colon polyps Sister    Colon polyps Sister    Colon cancer Neg Hx      Prior  to Admission medications   Medication Sig Start Date End Date Taking? Authorizing Provider  acetaminophen (TYLENOL) 500 MG tablet Take 1,000 mg by mouth every 6 (six) hours as needed for moderate pain.    [provider]  albuterol (VENTOLIN HFA) 108 (90 Base) MCG/ACT inhaler Inhale 2 puffs into the lungs every 6 (six) hours as needed for wheezing or shortness of breath.    [provider]  amLODipine (NORVASC) 5 MG tablet Take 5 mg by mouth daily.    [provider]   gabapentin (NEURONTIN) 300 MG capsule Take 300 mg by mouth 3 (three) times daily.    [provider]  levothyroxine (SYNTHROID) 75 MCG tablet Take 75 mcg by mouth daily before breakfast.    [provider]  Multiple Vitamin (MULTIVITAMIN WITH MINERALS) TABS tablet Take 1 tablet by mouth daily.    [provider]  olmesartan (BENICAR) 40 MG tablet Take 1 tablet (40 mg total) by mouth daily. 09/29/22 09/29/23  Nyoka Cowden, MD  omeprazole (PRILOSEC) 40 MG capsule Take 1 capsule by mouth twice daily 01/04/23   Aida Raider, NP  testosterone cypionate (DEPOTESTOSTERONE CYPIONATE) 200 MG/ML injection Inject 200 mg into the muscle every 14 (fourteen) days. 04/27/22   [provider]    Physical Exam: BP 130/82   Pulse 69   Temp 97.8 F (36.6 C)   Resp (!) 9   Ht 6' (1.829 m)   Wt 126 kg   SpO2 94%   BMI 37.67 kg/m   General: 67 y.o. year-old male well developed well nourished in no acute distress.  Alert and oriented x3. HEENT: NCAT, EOMI Neck: Supple, trachea medial Cardiovascular: Regular rate and rhythm with no rubs or gallops.  No thyromegaly or JVD noted.  No lower extremity edema. 2/4 pulses in all 4 extremities. Respiratory: Clear to auscultation with no wheezes or rales. Good inspiratory effort. Abdomen: Soft, nontender nondistended with normal bowel sounds x4 quadrants. Muskuloskeletal: No cyanosis, clubbing or edema noted bilaterally Neuro: CN II-XII intact, strength 5/5 x 4, sensation, reflexes intact Skin: No ulcerative lesions noted or rashes Psychiatry: Judgement and insight appear normal. Mood is appropriate for condition and setting          Labs on Admission:  Basic Metabolic Panel: Recent Labs  Lab 01/09/23 0207  NA 139  K 4.0  CL 102  CO2 30  GLUCOSE 113*  BUN 12  CREATININE 1.11  CALCIUM 8.9   Liver Function Tests: No results for input(s): "AST", "ALT", "ALKPHOS", "BILITOT", "PROT", "ALBUMIN" in the last 168  hours. No results for input(s): "LIPASE", "AMYLASE" in the last 168 hours. No results for input(s): "AMMONIA" in the last 168 hours. CBC: Recent Labs  Lab 01/09/23 0207  WBC 14.4*  HGB 16.9  HCT 51.6  MCV 88.2  PLT 231   Cardiac Enzymes: No results for input(s): "CKTOTAL", "CKMB", "CKMBINDEX", "TROPONINI" in the last 168 hours.  BNP (last 3 results) No results for input(s): "BNP" in the last 8760 hours.  ProBNP (last 3 results) No results for input(s): "PROBNP" in the last 8760 hours.  CBG: No results for input(s): "GLUCAP" in the last 168 hours.  Radiological Exams on Admission: DG Chest Port 1 View  Result Date: 01/09/2023 CLINICAL DATA:  Shortness of breath. EXAM: PORTABLE CHEST 1 VIEW COMPARISON:  December 31, 2022 FINDINGS: The heart size and mediastinal contours are within normal limits. Mild, stable atelectasis is seen within the left lung base. There is no  evidence of a pleural effusion or pneumothorax. The visualized skeletal structures are unremarkable. IMPRESSION: Stable exam without acute cardiopulmonary disease. Electronically Signed   By: Aram Candela M.D.   On: 01/09/2023 02:50    EKG: I independently viewed the EKG done and my findings are as followed: Normal sinus rhythm at rate of 71 bpm  Assessment/Plan Present on Admission:  Chest pain  Essential hypertension  Principal Problem:   Chest pain Active Problems:   Essential hypertension   Acquired hypothyroidism   GERD without esophagitis   Obesity (BMI 30-39.9)   Leukocytosis  Chest pain rule out ACS Cardiovascular risk factors include hypertension, hyperlipidemia, obesity Continue telemetry  Troponins x 1 - 8, continue to trend troponin EKG personally reviewed showed normal sinus rhythm at a rate of 71 bpm Cardiology will be consulted to help decide if Stress test is needed in am Versus other  diagnostic modalities.    Continue aspirin, nitroglycerin prn  Leukocytosis possibly reactive WBC  14.4, continue to monitor WBC with morning labs  Essential hypertension Continue amlodipine, Avapro  Acquired hypothyroidism Continue Synthroid  GERD Continue Protonix  Obesity (BMI 37.67) Diet and lifestyle modification  DVT prophylaxis: Lovenox  Advance Care Planning: Full code  Consults: Cardiology  Family Communication: None at bedside  Severity of Illness: The appropriate patient status for this patient is OBSERVATION. Observation status is judged to be reasonable and necessary in order to provide the required intensity of service to ensure the patient's safety. The patient's presenting symptoms, physical exam findings, and initial radiographic and laboratory data in the context of their medical condition is felt to place them at decreased risk for further clinical deterioration. Furthermore, it is anticipated that the patient will be medically stable for discharge from the hospital within 2 midnights of admission.   Author: Frankey Shown, DO 01/09/2023 3:40 AM  For on call review [email or web address].

## 2023-01-09 NOTE — ED Notes (Signed)
ED Provider at bedside. 

## 2023-01-09 NOTE — ED Provider Notes (Signed)
[place] EMERGENCY DEPARTMENT AT [facility]  Provider Note  CSN: [id number] Arrival date & time: 01/09/23 0155  History Chief Complaint  Patient presents with   Chest Pain    Keith Solis is a 67 y.o. male with history of HTN and former smoker presents for evaluation of chest pain radiating into back and jaw. Associated with diaphoresis, onset earlier in the evening, and improved with ASA x 2, but returned while he was trying to sleep prompting his ED visit. He had a similar episode on 4/13 and was seen in the ED then. EKG, Trop and CTA were negative at that visit but he refused admission then. He saw cardiology on 4/15 and is scheduled for a stress test on Wednesday of this week. He reports symptoms have improved but still reports 6/10 pain.    Home Medications Prior to Admission medications   Medication Sig Start Date End Date Taking? Authorizing Provider  acetaminophen (TYLENOL) 500 MG tablet Take 1,000 mg by mouth every 6 (six) hours as needed for moderate pain.    [provider]  albuterol (VENTOLIN HFA) 108 (90 Base) MCG/ACT inhaler Inhale 2 puffs into the lungs every 6 (six) hours as needed for wheezing or shortness of breath.    [provider]  amLODipine (NORVASC) 5 MG tablet Take 5 mg by mouth daily.    [provider]  gabapentin (NEURONTIN) 300 MG capsule Take 300 mg by mouth 3 (three) times daily.    [provider]  levothyroxine (SYNTHROID) 75 MCG tablet Take 75 mcg by mouth daily before breakfast.    [provider]  Multiple Vitamin (MULTIVITAMIN WITH MINERALS) TABS tablet Take 1 tablet by mouth daily.    [provider]  olmesartan (BENICAR) 40 MG tablet Take 1 tablet (40 mg total) by mouth daily. 09/29/22 09/29/23  Nyoka Cowden, MD  omeprazole (PRILOSEC) 40 MG capsule Take 1 capsule by mouth twice daily 01/04/23   Aida Raider, NP  testosterone cypionate (DEPOTESTOSTERONE CYPIONATE) 200 MG/ML  injection Inject 200 mg into the muscle every 14 (fourteen) days. 04/27/22   [provider]     Allergies    Patient has no known allergies.   Review of Systems   Review of Systems Please see HPI for pertinent positives and negatives  Physical Exam BP (!) 143/84   Pulse 67   Temp 97.8 F (36.6 C)   Resp 13   Ht 6' (1.829 m)   Wt 126 kg   SpO2 96%   BMI 37.67 kg/m   Physical Exam Vitals and nursing note reviewed.  Constitutional:      Appearance: Normal appearance.  HENT:     Head: Normocephalic and atraumatic.     Nose: Nose normal.     Mouth/Throat:     Mouth: Mucous membranes are moist.  Eyes:     Extraocular Movements: Extraocular movements intact.     Conjunctiva/sclera: Conjunctivae normal.  Cardiovascular:     Rate and Rhythm: Normal rate.  Pulmonary:     Effort: Pulmonary effort is normal.     Breath sounds: Normal breath sounds.  Abdominal:     General: Abdomen is flat.     Palpations: Abdomen is soft.     Tenderness: There is no abdominal tenderness.  Musculoskeletal:        General: No swelling. Normal range of motion.     Cervical back: Neck supple.  Skin:    General: Skin is warm  and dry.  Neurological:     General: No focal deficit present.     Mental Status: He is alert.  Psychiatric:        Mood and Affect: Mood normal.     ED Results / Procedures / Treatments   EKG EKG Interpretation  Date/Time:  Monday January 09 2023 02:03:31 EDT Ventricular Rate:  71 PR Interval:  158 QRS Duration: 107 QT Interval:  379 QTC Calculation: 412 R Axis:   35 Text Interpretation: Sinus rhythm Normal ECG No significant change since last tracing Confirmed by Susy Frizzle [phone]) on 01/09/2023 2:07:29 AM  Procedures Procedures  Medications Ordered in the ED Medications  nitroGLYCERIN (NITROSTAT) SL tablet 0.4 mg (0.4 mg Sublingual Given 01/09/23 0249)  aspirin chewable tablet 324 mg (324 mg Oral Given 01/09/23 0220)    Initial Impression  and Plan  Patient here with symptoms concerning for ACS. Had a normal CTA aorta last week so will not repeat that now. Will check labs, give ASA and NTG. He is amenable to admission and expedited stress test now.   ED Course   Clinical Course as of 01/09/23 0310  Mon Jan 09, 2023  0216 CBC with mild leukocytosis, similar to previous.  [CS]  0238 BMP and Trop #1 are normal.  [CS]  0252 I personally viewed the images from radiology studies and agree with radiologist interpretation:  CXR is clear. Patient reports pain in chest resolved after NTG, still some upper back pain. Will discuss admission with Hospitalist [CS]  0308 Spoke with Dr. Thomes Dinning, Hospitalist, who will evaluate for admission. [CS]    Clinical Course User Index [CS] Pollyann Savoy, MD     MDM Rules/Calculators/A&P Medical Decision Making Problems Addressed: Chest pain, unspecified type: acute illness or injury  Amount and/or Complexity of Data Reviewed Labs: ordered. Decision-making details documented in ED Course. Radiology: ordered and independent interpretation performed. Decision-making details documented in ED Course. ECG/medicine tests: ordered and independent interpretation performed. Decision-making details documented in ED Course.  Risk OTC drugs. Prescription drug management. Decision regarding hospitalization.     Final Clinical Impression(s) / ED Diagnoses Final diagnoses:  Chest pain, unspecified type    Rx / DC Orders ED Discharge Orders     None        Pollyann Savoy, MD 01/09/23 [phone]

## 2023-01-09 NOTE — Progress Notes (Addendum)
TSH abnormal. Free T4 ordered and pending. Patient had recurrent CP this afternoon. EKGs performed and repeated due to artifact, PVCs. Dr. Jenene Slicker reviewed tracings and felt stable. Received 3 SL NTG. Pain eventually resolved/  VSS. Patient had reading of O2 sat 83% listed felt inaccurate, recheck in process by nursing staff per phone discussion. F/u troponin negative, has another value pending. Dr. Jenene Slicker initiated Imdur. Patient remains pain free presently. Discussed question of heparin with Dr. Jenene Slicker - given f/u troponin is still negative, will hold off, but has another value pending -> if this does rise, would initiate heparin per pharmacy. Will s/o to APP on call at [facility].

## 2023-01-09 NOTE — Progress Notes (Addendum)
Patient admitted after midnight,please see H&P.  Here with chest pain.  Was seen in the ER about a week ago and was scheduled for an outpatient stress test but before he could have, he presented back to the ER with chest pain.CE negative.  Echo 4/22 and stress test 4/23 was planned but unable to be done so now patient to tx to [facility] for CTA.  Cardiology consult appreciated. Marlin Canary DO

## 2023-01-09 NOTE — Consult Note (Addendum)
Cardiology Consultation   Patient ID: Keith Solis MRN: [id number]; DOB: [birth year]  Admit date: 01/09/2023 Date of Consult: 01/09/2023  PCP:  Benita Stabile, MD   [facility] Providers Cardiologist:  Christell Constant, MD        Patient Profile:   Keith Solis is a 67 y.o. male with a hx of HTN with prior orthostatic hypotension, former tobacco use (20 yrs), kerosene ingestion age 37 (reports scarring of lungs), PVCs, hypothyroidism, GERD, obesity, diet-controlled DM, neuropathy, ED, hypogonadism, mild dilation of aortic root by echo 03/2022 (not seen on CTA 12/2022), hiatal hernia, Schatzki's ring, gastritis who is being seen 01/09/2023 for the evaluation of chest pain at the request of Dr. Thomes Dinning.  History of Present Illness:   Keith Solis was first evaluated by Dr .Izora Ribas in 03/2022 for near-syncope in the setting of doing heavy yardwork, noted to have SBP 70s and AKI, improved with IVF, felt related to OTC supplement and antihypertensives. Telemetry showed 5 beat run NSVT and frequent PVCs. Echo showed EF 55-60%, not optimally defined to evaluate WMA, mild LVH, normal RV, moderate LAE, mild dilation of aortic root. Of note, EGD 06/2022 for dysphagia and heartburn showed gastritis, hiatal hernia, moderate Schatzki's ring that was dilated.   At baseline he is able to walk the dogs without any exertional angina. He does report chronic DOE which he reports stems from ingestion of kerosene age 19 with severe illness and lung scarring. He quit smoking about 12 years ago. His father had CABG/stents starting age 60 and father's mom also died of MI though details not totally clear. He was seen in ED 12/31/22 for increased chest discomfort with exertion along left arm and behind shoulder blades, troponins neg x2. CTA c/a/p was negative for dissection or acute vascular abnormality, no significant atherosclerotic calcification, + prostatomegaly, lumbar DDD. EKG unrevealing.  Patient refused admission at that time. He saw Sharlene Dory in the office 01/02/23 who recommended repeat ETT and sleep study, pending.   He felt relatively well between ER visits but returned to the ER last overnight with recurrent chest discomfort. He was at church yesterday and felt like he was getting hot with some vague generalized chest discomfort that went between his shoulder blades and with some left arm tingling. No increased SOB from usual, nausea, diaphoresis, palpitations. He stopped by the store and chewed 2 baby ASA with minor relief. No aggravating factors. He went home and tried to go to sleep but woke around 1am still uncomfortable so came to ER. Here he got 324mg  ASA + 2 SL NTG - reports complete relief with NTG. Workup reveals neg troponins x 2, leukocytosis of 14.4 (present back to 03/2022), normal Hgb, Cr 1.11. CXR NAD. EKG nonacute. VSS.    Past Medical History:  Diagnosis Date   Diabetes mellitus    Gastritis    GERD (gastroesophageal reflux disease)    Hiatal hernia    Hypertension    Neuropathy    Obesity    Schatzki's ring     Past Surgical History:  Procedure Laterality Date   BALLOON DILATION N/A 07/19/2022   Procedure: BALLOON DILATION;  Surgeon: Lanelle Bal, DO;  [facility];  Service: Endoscopy;  Laterality: N/A;   BIOPSY  07/19/2022   Procedure: BIOPSY;  Surgeon: Lanelle Bal, DO;  [facility];  Service: Endoscopy;;   cholecystectomy N/A    COLONOSCOPY WITH PROPOFOL N/A 07/19/2022   Procedure: COLONOSCOPY WITH PROPOFOL;  Surgeon: Lanelle Bal, DO;  [facility];  Service: Endoscopy;  Laterality: N/A;   ESOPHAGOGASTRODUODENOSCOPY (EGD) WITH PROPOFOL N/A 07/19/2022   Procedure: ESOPHAGOGASTRODUODENOSCOPY (EGD) WITH PROPOFOL;  Surgeon: Lanelle Bal, DO;  [facility]: [facility];  Service: Endoscopy;  Laterality: N/A;   INGUINAL HERNIA REPAIR     POLYPECTOMY  07/19/2022   Procedure: POLYPECTOMY;   Surgeon: Lanelle Bal, DO;  [facility];  Service: Endoscopy;;   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     Home Medications:  Prior to Admission medications   Medication Sig Start Date End Date Taking? Authorizing Provider  albuterol (VENTOLIN HFA) 108 (90 Base) MCG/ACT inhaler Inhale 2 puffs into the lungs every 6 (six) hours as needed for wheezing or shortness of breath.   Yes [provider]  amLODipine (NORVASC) 5 MG tablet Take 5 mg by mouth daily.   Yes [provider]  aspirin EC 81 MG tablet Take 81 mg by mouth daily. Swallow whole.   Yes [provider]  gabapentin (NEURONTIN) 300 MG capsule Take 300 mg by mouth 3 (three) times daily.   Yes [provider]  levothyroxine (SYNTHROID) 75 MCG tablet Take 75 mcg by mouth daily before breakfast.   Yes [provider]  olmesartan (BENICAR) 40 MG tablet Take 1 tablet (40 mg total) by mouth daily. 09/29/22 09/29/23 Yes Nyoka Cowden, MD  omeprazole (PRILOSEC) 40 MG capsule Take 1 capsule by mouth twice daily 01/04/23  Yes Mahon, Frederik Schmidt, NP  testosterone cypionate (DEPOTESTOSTERONE CYPIONATE) 200 MG/ML injection Inject 200 mg into the muscle every 14 (fourteen) days. 04/27/22  Yes [provider]    Inpatient Medications: Scheduled Meds:  amLODipine  5 mg Oral Daily   [START ON 01/10/2023] aspirin EC  81 mg Oral Daily   enoxaparin (LOVENOX) injection  40 mg Subcutaneous Q24H   irbesartan  300 mg Oral Daily   levothyroxine  75 mcg Oral Q0600   pantoprazole  80 mg Oral Daily   Continuous Infusions:  PRN Meds: acetaminophen **OR** acetaminophen, nitroGLYCERIN, ondansetron **OR** ondansetron (ZOFRAN) IV  Allergies:   No Known Allergies  Social History:   Social History   Socioeconomic History   Marital status: Married    Spouse name: Not on file   Number of children: Not on file   Years of education: Not on file   Highest education level: Not on file   Occupational History   Not on file  Tobacco Use   Smoking status: Former    Types: Cigarettes    Passive exposure: Never   Smokeless tobacco: Never  Vaping Use   Vaping Use: Never used  Substance and Sexual Activity   Alcohol use: Not Currently   Drug use: Never   Sexual activity: Yes  Other Topics Concern   Not on file  Social History Narrative   Not on file   Social Determinants of Health   Financial Resource Strain: Not on file  Food Insecurity: Not on file  Transportation Needs: Not on file  Physical Activity: Not on file  Stress: Not on file  Social Connections: Not on file  Intimate Partner Violence: Not on file    Family History:    Family History  Problem Relation Age of Onset   Colon polyps Mother    Colon polyps Father    Colon polyps Sister    Colon polyps Sister    Colon cancer Neg Hx      ROS:  Please see the history of present illness.  All other ROS reviewed and negative.     Physical Exam/Data:   Vitals:   01/09/23 0500 01/09/23 0530 01/09/23 0630 01/09/23 0806  BP: 125/78 128/84 129/72 126/61  Pulse: 61 66 62 68  Resp: 14 (!) Temp:    97.9 F (36.6 C)  TempSrc:    Oral  SpO2: 95% 96% 95% 95%  Weight:      Height:       No intake or output data in the 24 hours ending 01/09/23 0904    01/09/2023    2:02 AM 01/02/2023   10:10 AM 12/31/2022    3:39 PM  Last 3 Weights  Weight (lbs) 277 lb 12.5 oz 277 lb 270 lb  Weight (kg) 126 kg 125.646 kg 122.471 kg     Body mass index is 37.67 kg/m.  General: Well developed, well nourished, in no acute distress. Head: Normocephalic, atraumatic, sclera non-icteric, no xanthomas, nares are without discharge. Neck: Negative for carotid bruits. JVP not elevated. Lungs: Clear bilaterally to auscultation without wheezes, rales, or rhonchi. Breathing is unlabored. Heart: RRR S1 S2 without murmurs, rubs, or gallops.  Abdomen: Soft, non-tender, non-distended with normoactive bowel sounds. No  rebound/guarding. Extremities: No clubbing or cyanosis. No edema. Distal pedal pulses are 2+ and equal bilaterally. Mild nonpitting ankle edema. Neuro: Alert and oriented X 3. Moves all extremities spontaneously. Psych:  Responds to questions appropriately with a normal affect.   EKG:  The EKG was personally reviewed and demonstrates:  NSR 71bpm, no acute STT changes Telemetry:  Telemetry was personally reviewed and demonstrates:  NSR, occasional PVCS/couplets  Relevant CV Studies: 2D echo 03/2022    1. Left ventricular ejection fraction, by estimation, is 55 to 60%. The  left ventricle has normal function. Left ventricular endocardial border  not optimally defined to evaluate regional wall motion, though no seen in  views obtained (cannot see  anterolated myocardial borders well). There is mild concentric left  ventricular hypertrophy. Left ventricular diastolic parameters are  indeterminate.   2. Right ventricular systolic function is normal. The right ventricular  size is normal. Tricuspid regurgitation signal is inadequate for assessing  PA pressure.   3. Left atrial size was moderately dilated.   4. The mitral valve is normal in structure. No evidence of mitral valve  regurgitation. No evidence of mitral stenosis.   5. The aortic valve is tricuspid. Aortic valve regurgitation is not  visualized. No aortic stenosis is present.   6. Aortic dilatation noted. There is mild dilatation of the aortic root,  measuring 40 mm.   7. The inferior vena cava is normal in size with greater than 50%  respiratory variability, suggesting right atrial pressure of 3 mmHg.   Comparison(s): No prior Echocardiogram.    Laboratory Data:  High Sensitivity Troponin:   Recent Labs  Lab 12/31/22 1520 12/31/22 1728 01/09/23 0207 01/09/23 0415  TROPONINIHS Chemistry Recent Labs  Lab 01/09/23 0207 01/09/23 0415  NA 139  --   K 4.0  --   CL 102  --   CO2 30  --   GLUCOSE 113*   --   BUN 12  --   CREATININE 1.11  --   CALCIUM 8.9  --   MG  --  1.9  GFRNONAA >60  --   ANIONGAP 7  --     No results for input(s): "PROT", "ALBUMIN", "AST", "  ALT", "ALKPHOS", "BILITOT" in the last 168 hours. Lipids No results for input(s): "CHOL", "TRIG", "HDL", "LABVLDL", "LDLCALC", "CHOLHDL" in the last 168 hours.  Hematology Recent Labs  Lab 01/09/23 0207  WBC 14.4*  RBC 5.85*  HGB 16.9  HCT 51.6  MCV 88.2  MCH 28.9  MCHC 32.8  RDW 13.7  PLT 231   Thyroid No results for input(s): "TSH", "FREET4" in the last 168 hours.  BNPNo results for input(s): "BNP", "PROBNP" in the last 168 hours.  DDimer No results for input(s): "DDIMER" in the last 168 hours.   Radiology/Studies:  [facility] Chest Port 1 View  Result Date: 01/09/2023 CLINICAL DATA:  Shortness of breath. EXAM: PORTABLE CHEST 1 VIEW COMPARISON:  December 31, 2022 FINDINGS: The heart size and mediastinal contours are within normal limits. Mild, stable atelectasis is seen within the left lung base. There is no evidence of a pleural effusion or pneumothorax. The visualized skeletal structures are unremarkable. IMPRESSION: Stable exam without acute cardiopulmonary disease. Electronically Signed   By: Aram Candela M.D.   On: 01/09/2023 02:50     Assessment and Plan:   1. Chest pain of uncertain etiology - 2 ER visits thus far for this, negative EKG and troponins but + RF of tobacco abuse, HTN, DM, family hx of CAD (father CABG/PCI starting age 7), responsive to SL NTG per patient - since got  ASA yesterday evening +  after 2am, will move  daily to tomorrow AM - hold off heparin given currently pain free and troponins negative - check TSH, LFTs with labs - add lipid panel to AM labs and consider statin based on result - per d/w Dr. Jenene Slicker, plan inpatient stress test  - unable to accommodate on schedule today per nuc med but will plan for tomorrow - in the interim will order echocardiogram here  - sent msg  to office nurse to cancel prior OP ETT/echo appts/orders in the queue - ADDENDUM: Was informed by nuc med nurse they cannot accommodate stress test tomorrow either. Per d/w Dr. Jenene Slicker and our [facility] team we'll plan transfer to [facility] on the medicine service for coronary CTA when he arrives at [facility]. That may be today or tomorrow depending on bed availability. Cardmaster and Dr. Benjamine Mola notified as well. I asked cardmaster to add [facility] APP to chat to follow for results when they are available. Would anticipate cath if cor CT warrants. Otherwise pt has f/u with Sharlene Dory in 02/2023.  2. HTN with h/o orthostatic hypotension - no acute concerns, OK to continue home regimen  3. Leukocytosis - present back to 03/2022, recommend OP f/u with PCP  4. DM  - prior notes indicate A1C 6.5, on diet control - check A1C and add SSI  5. PVCs/couplets (relatively asymptomatic with these) - K 4.0, Mg 1.9 - will supplement magnesium - update echo while in patient given new symptoms - consider addition of low dose BB (HR 60s)   Risk Assessment/Risk Scores:     TIMI Risk Score for Unstable Angina or Non-ST Elevation MI:   The patient's TIMI risk score is 4, which indicates a 20% risk of all cause mortality, new or recurrent myocardial infarction or need for urgent revascularization in the next 14 days.  For questions or updates, please contact [facility] Please consult [email or web address] for contact info under    Signed, Laurann Montana, PA-C  01/09/2023 9:04 AM    Attending attestation  Patient seen and independently examined with Ronie Spies, PA-C.  We discussed all aspects of the encounter. I agree with the assessment and plan as stated above.  Patient is a 67 year old M known to have HTN DM2 (HbA1c 6.5 on diet control) presented to ER with chest pain. He has been having almost persistent chest pain for the last 4 weeks prompting ER visit on 12/31/2022 with no intervention. He again returned to ER on  01/09/2023 with a chief complaint of chest pain. He started to have this chest pains that started in the center of his chest and radiated to the back of his chest especially to his shoulder blades 4 weeks ago and thought it could be because he stretched his muscle. Since the pain is not going away, he had a ER visit on 12/31/2022. EKG showed nonspecific T wave inversion in lead III but otherwise no ischemia.  EKG today also showed no ischemia.  High-sensitivity troponins were within normal limits. Patient's chest pain completely resolved after SL NTG x 2. In the ER. No chest pain during my interview. Telemetry reviewed, frequent PVCs and NSR.  Physical examination is remarkable for patient not in acute respiratory distress, HEENT normal, no JVD, S1-S2 normal, clear lungs, nondistended and nontender abdomen, no pitting edema in bilateral lower extremities.  # Possibly cardiac chest pain, atypical: Patient has chest pains for the last 4 weeks but yesterday he did have intense chest pain that was relieved by SL NTG x 2. The chest pains never went away according to the patient but there were no EKG changes. Troponins were within normal limits. Patient was originally scheduled for nuclear stress test today but due to staffing issues, he will have to be transferred to [facility] for CTA cardiac. If no room on the CT cardiac schedule, can get a stress test. # Diabetes mellitus type 2: Obtain repeat HbA1c, he probably needs medication management, management per primary team. # HTN: Patient currently is on amlodipine 5 mg once daily and irbesartan 300 mg once daily.  Obtain orthostatic vitals as there was documentation of orthostatic hypotension. # Frequent PVCs on the telemetry: After transfer to [facility], one of his antihypertensive medications can be switched to beta-blocker.  Otherwise, asymptomatic.   I have spent a total of 50 minutes with patient reviewing chart , telemetry, EKGs, labs and examining  patient as well as establishing an assessment and plan that was discussed with the patient.  > 50% of time was spent in direct patient care.    Janeese Mcgloin Verne Spurr, MD [place]  [facility]  11:42 AM

## 2023-01-09 NOTE — ED Notes (Signed)
Hospitalist at bedside 

## 2023-01-09 NOTE — ED Triage Notes (Addendum)
Pt with c/o CP that radiates to jaw and between shoulder blades. States pain started yesterday and has been intermittent. Pt states he took 2 baby ASA around 2000 last night. Pt seen here recently for same and states that he is supposed to have a stress test on Wed.

## 2023-01-09 NOTE — Progress Notes (Signed)
*  PRELIMINARY RESULTS* Echocardiogram 2D Echocardiogram has been performed.  Stacey Drain 01/09/2023, 3:37 PM

## 2023-01-09 NOTE — Progress Notes (Signed)
  Transition of Care [facility]) Screening Note   Patient Details  Name: Keith Solis Date of Birth: 01-Mar-1956   Transition of Care [facility]) CM/SW Contact:    Villa Herb, LCSWA Phone Number: 01/09/2023, 11:49 AM    Transition of Care Department [facility]) has reviewed patient and no TOC needs have been identified at this time. We will continue to monitor patient advancement through interdisciplinary progression rounds. If new patient transition needs arise, please place a TOC consult.

## 2023-01-10 ENCOUNTER — Observation Stay (HOSPITAL_BASED_OUTPATIENT_CLINIC_OR_DEPARTMENT_OTHER): Payer: Medicare PPO

## 2023-01-10 ENCOUNTER — Encounter (HOSPITAL_COMMUNITY): Payer: Self-pay | Admitting: Internal Medicine

## 2023-01-10 DIAGNOSIS — R079 Chest pain, unspecified: Secondary | ICD-10-CM | POA: Diagnosis not present

## 2023-01-10 DIAGNOSIS — Z79899 Other long term (current) drug therapy: Secondary | ICD-10-CM | POA: Diagnosis not present

## 2023-01-10 DIAGNOSIS — I10 Essential (primary) hypertension: Secondary | ICD-10-CM | POA: Diagnosis not present

## 2023-01-10 DIAGNOSIS — R072 Precordial pain: Secondary | ICD-10-CM

## 2023-01-10 DIAGNOSIS — E669 Obesity, unspecified: Secondary | ICD-10-CM | POA: Diagnosis not present

## 2023-01-10 DIAGNOSIS — E039 Hypothyroidism, unspecified: Secondary | ICD-10-CM | POA: Diagnosis not present

## 2023-01-10 DIAGNOSIS — Z87891 Personal history of nicotine dependence: Secondary | ICD-10-CM | POA: Diagnosis not present

## 2023-01-10 DIAGNOSIS — E119 Type 2 diabetes mellitus without complications: Secondary | ICD-10-CM | POA: Diagnosis not present

## 2023-01-10 DIAGNOSIS — R0789 Other chest pain: Secondary | ICD-10-CM | POA: Diagnosis present

## 2023-01-10 DIAGNOSIS — D72829 Elevated white blood cell count, unspecified: Secondary | ICD-10-CM | POA: Diagnosis not present

## 2023-01-10 DIAGNOSIS — Z6839 Body mass index (BMI) 39.0-39.9, adult: Secondary | ICD-10-CM | POA: Diagnosis not present

## 2023-01-10 LAB — COMPREHENSIVE METABOLIC PANEL
ALT: 14 U/L (ref 0–44)
AST: 17 U/L (ref 15–41)
Albumin: 3.5 g/dL (ref 3.5–5.0)
Alkaline Phosphatase: 59 U/L (ref 38–126)
Anion gap: 11 (ref 5–15)
BUN: 11 mg/dL (ref 8–23)
CO2: 27 mmol/L (ref 22–32)
Calcium: 8.8 mg/dL — ABNORMAL LOW (ref 8.9–10.3)
Chloride: 100 mmol/L (ref 98–111)
Creatinine, Ser: 1.13 mg/dL (ref 0.61–1.24)
GFR, Estimated: >60 mL/min (ref >60)
Glucose, Bld: 101 mg/dL — ABNORMAL HIGH (ref 70–99)
Potassium: 3.7 mmol/L (ref 3.5–5.1)
Sodium: 138 mmol/L (ref 135–145)
Total Bilirubin: 0.8 mg/dL (ref 0.3–1.2)
Total Protein: 6.3 g/dL — ABNORMAL LOW (ref 6.5–8.1)

## 2023-01-10 LAB — GLUCOSE, CAPILLARY
Glucose-Capillary: 77 mg/dL (ref 70–99)
Glucose-Capillary: 99 mg/dL (ref 70–99)
Glucose-Capillary: 90 mg/dL (ref 70–99)
Glucose-Capillary: 134 mg/dL — ABNORMAL HIGH (ref 70–99)

## 2023-01-10 LAB — CBC
HCT: 47.7 % (ref 39.0–52.0)
Hemoglobin: 15.3 g/dL (ref 13.0–17.0)
MCH: 28.2 pg (ref 26.0–34.0)
MCHC: 32.1 g/dL (ref 30.0–36.0)
MCV: 87.8 fL (ref 80.0–100.0)
Platelets: 241 10*3/uL (ref 150–400)
RBC: 5.43 MIL/uL (ref 4.22–5.81)
RDW: 13.8 % (ref 11.5–15.5)
WBC: 12.8 10*3/uL — ABNORMAL HIGH (ref 4.0–10.5)
nRBC: 0 % (ref 0.0–0.2)

## 2023-01-10 LAB — LIPID PANEL
Cholesterol: 74 mg/dL (ref 0–200)
HDL: 27 mg/dL — ABNORMAL LOW (ref >40)
LDL Cholesterol: 33 mg/dL (ref 0–99)
Total CHOL/HDL Ratio: 2.7 RATIO
Triglycerides: 71 mg/dL (ref <150)
VLDL: 14 mg/dL (ref 0–40)

## 2023-01-10 LAB — MAGNESIUM: Magnesium: 1.9 mg/dL (ref 1.7–2.4)

## 2023-01-10 MED ORDER — IOHEXOL 350 MG/ML SOLN
95.0000 mL | Freq: Once | INTRAVENOUS | Status: AC | PRN
  Administered 2023-01-10: 95 mL via INTRAVENOUS

## 2023-01-10 MED ORDER — NITROGLYCERIN 0.4 MG SL SUBL
SUBLINGUAL_TABLET | SUBLINGUAL | Status: AC
  Filled 2023-01-10: qty 2

## 2023-01-10 MED ORDER — GABAPENTIN 300 MG PO CAPS
300.0000 mg | ORAL_CAPSULE | Freq: Three times a day (TID) | ORAL | Status: DC
  Administered 2023-01-10 – 2023-01-11 (×5): 300 mg via ORAL
  Filled 2023-01-10 (×5): qty 1

## 2023-01-10 MED ORDER — NITROGLYCERIN 0.4 MG SL SUBL
0.8000 mg | SUBLINGUAL_TABLET | Freq: Once | SUBLINGUAL | Status: AC
  Administered 2023-01-10: 0.8 mg via SUBLINGUAL

## 2023-01-10 NOTE — Plan of Care (Signed)

## 2023-01-10 NOTE — Progress Notes (Addendum)
Pts O2 sats noted to be 63% on RA. Pt sleeping soundly, easily aroused and O2 sats up to 93%. Pt went back to sleep and sats down to 83%. Pt placed on O2 2L/[place] while sleeping.

## 2023-01-10 NOTE — Progress Notes (Signed)
PROGRESS NOTE    Keith Solis  ZOX:[id number] DOB: 08-22-1956 DOA: 01/09/2023 PCP: Benita Stabile, MD    Brief Narrative:  No notes on file    Assessment and Plan: Chest pain -getting CTA -echo done -cards consult  Suspected sleep apnea -cards to arrange sleep study outpatient  Leukocytosis possibly reactive -trending down   Essential hypertension Continue amlodipine, Avapro   Acquired hypothyroidism Continue Synthroid   GERD Continue Protonix   Obesity  Estimated body mass index is 36.97 kg/m as calculated from the following:   Height as of this encounter: 6' (1.829 m).   Weight as of this encounter: 123.7 kg.   DVT prophylaxis: enoxaparin (LOVENOX) injection 40 mg Start: 01/09/23 1000 SCDs Start: 01/09/23 0330    Code Status: Full Code  Disposition Plan:  Level of care: Telemetry Cardiac Status is: Observation The patient remains OBS appropriate and will d/c before 2 midnights.- pending CTA results    Consultants:  cards   Subjective: No SOB  Objective: Vitals:   01/10/23 0419 01/10/23 0754 01/10/23 1059 01/10/23 1108  BP: (!) 104/59 120/72 121/68 118/70  Pulse:  76 (!) 59   Resp: 18 17    Temp: 97.6 F (36.4 C) 98.4 F (36.9 C)    TempSrc: Oral Oral    SpO2:  94%    Weight:      Height:       No intake or output data in the 24 hours ending 01/10/23 1154 Filed Weights   01/09/23 0202 01/10/23 0118  Weight: 126 kg 123.7 kg    Examination:   General: Appearance:    Obese male in no acute distress     Lungs:      respirations unlabored  Heart:    Bradycardic.    MS:   All extremities are intact.    Neurologic:   Awake, alert       Data Reviewed: I have personally reviewed following labs and imaging studies  CBC: Recent Labs  Lab 01/09/23 0207 01/10/23 0248  WBC 14.4* 12.8*  HGB 16.9 15.3  HCT 51.6 47.7  MCV 88.2 87.8  PLT 231 241   Basic Metabolic Panel: Recent Labs  Lab 01/09/23 0207 01/09/23 0415  01/10/23 0248  NA 139  --  138  K 4.0  --  3.7  CL 102  --  100  CO2 30  --  27  GLUCOSE 113*  --  101*  BUN 12  --  11  CREATININE 1.11  --  1.13  CALCIUM 8.9  --  8.8*  MG  --  1.9 1.9  PHOS  --  2.6  --    GFR: Estimated Creatinine Clearance: 88.5 mL/min (by C-G formula based on SCr of 1.13 mg/dL). Liver Function Tests: Recent Labs  Lab 01/09/23 1024 01/10/23 0248  AST 13* 17  ALT 14 14  ALKPHOS 61 59  BILITOT 1.3* 0.8  PROT 6.9 6.3*  ALBUMIN 3.8 3.5   No results for input(s): "LIPASE", "AMYLASE" in the last 168 hours. No results for input(s): "AMMONIA" in the last 168 hours. Coagulation Profile: No results for input(s): "INR", "PROTIME" in the last 168 hours. Cardiac Enzymes: No results for input(s): "CKTOTAL", "CKMB", "CKMBINDEX", "TROPONINI" in the last 168 hours. BNP (last 3 results) No results for input(s): "PROBNP" in the last 8760 hours. HbA1C: Recent Labs    01/09/23 0415  HGBA1C 5.9*   CBG: Recent Labs  Lab 01/09/23 1224 01/09/23 1749 01/09/23 2159 01/10/23 0757  GLUCAP 114* 118* 142* 77   Lipid Profile: Recent Labs    01/10/23 0248  CHOL 74  HDL 27*  LDLCALC 33  TRIG 71  CHOLHDL 2.7   Thyroid Function Tests: Recent Labs    01/09/23 0415 01/09/23 1024  TSH 5.999*  --   FREET4  --  0.98   Anemia Panel: No results for input(s): "VITAMINB12", "FOLATE", "FERRITIN", "TIBC", "IRON", "RETICCTPCT" in the last 72 hours. Sepsis Labs: No results for input(s): "PROCALCITON", "LATICACIDVEN" in the last 168 hours.  No results found for this or any previous visit (from the past 240 hour(s)).       Radiology Studies: ECHOCARDIOGRAM COMPLETE  Result Date: 01/09/2023    ECHOCARDIOGRAM REPORT   Patient Name:   Keith Solis Date of Exam: 01/09/2023 Medical Rec #:  [id number]       Height:       72.0 in Accession #:    [id number]      Weight:       277.8 lb Date of Birth:  Jan 15, 1956       BSA:          2.450 m Patient Age:    66 years         BP:           113/71 mmHg Patient Gender: M               HR:           71 bpm. [facility] Procedure: 2D Echo, Cardiac Doppler and Color Doppler Indications:    Chest Pain R07.9  History:        Patient has prior history of Echocardiogram examinations, most                 recent 04/15/2022. Arrythmias:PVC; Risk Factors:Hypertension. Hx                 of GERD.  Sonographer:    Celesta Gentile RCS Referring Phys: [phone] DAYNA N DUNN IMPRESSIONS  1. Left ventricular ejection fraction, by estimation, is 50 to 55%. The left ventricle has low normal function. Left ventricular endocardial border not optimally defined to evaluate regional wall motion. There is mild left ventricular hypertrophy. Left ventricular diastolic parameters are indeterminate.  2. Right ventricular systolic function is normal. The right ventricular size is normal. Tricuspid regurgitation signal is inadequate for assessing PA pressure.  3. Left atrial size was moderately dilated.  4. The mitral valve is grossly normal. No evidence of mitral valve regurgitation. No evidence of mitral stenosis.  5. The aortic valve is tricuspid. Aortic valve regurgitation is not visualized. No aortic stenosis is present.  6. Aortic dilatation noted. There is mild dilatation of the aortic root, measuring 40 mm.  7. The inferior vena cava is dilated in size with >50% respiratory variability, suggesting right atrial pressure of 8 mmHg. Comparison(s): No significant change from prior study. FINDINGS  Left Ventricle: Left ventricular ejection fraction, by estimation, is 50 to 55%. The left ventricle has low normal function. Left ventricular endocardial border not optimally defined to evaluate regional wall motion. The left ventricular internal cavity  size was normal in size. There is mild left ventricular hypertrophy. Left ventricular diastolic parameters are indeterminate. Right Ventricle: The right ventricular size is normal. No increase in right ventricular  wall thickness. Right ventricular systolic function is normal. Tricuspid regurgitation signal is inadequate for assessing PA pressure. Left Atrium: Left atrial size was moderately dilated. Right  Atrium: Right atrial size was normal in size. Pericardium: There is no evidence of pericardial effusion. Mitral Valve: The mitral valve is grossly normal. No evidence of mitral valve regurgitation. No evidence of mitral valve stenosis. Tricuspid Valve: The tricuspid valve is grossly normal. Tricuspid valve regurgitation is not demonstrated. No evidence of tricuspid stenosis. Aortic Valve: The aortic valve is tricuspid. Aortic valve regurgitation is not visualized. No aortic stenosis is present. Pulmonic Valve: The pulmonic valve was not well visualized. Pulmonic valve regurgitation is not visualized. No evidence of pulmonic stenosis. Aorta: Aortic dilatation noted. There is mild dilatation of the aortic root, measuring 40 mm. Venous: The inferior vena cava is dilated in size with greater than 50% respiratory variability, suggesting right atrial pressure of 8 mmHg. IAS/Shunts: No atrial level shunt detected by color flow Doppler.  LEFT VENTRICLE PLAX 2D LVIDd:         6.10 cm   Diastology LVIDs:         4.20 cm   LV e' medial:    6.20 cm/s LV PW:         1.30 cm   LV E/e' medial:  12.1 LV IVS:        1.20 cm   LV e' lateral:   10.90 cm/s LVOT diam:     2.30 cm   LV E/e' lateral: 6.9 LV SV:         103 LV SV Index:   42 LVOT Area:     4.15 cm  RIGHT VENTRICLE RV S prime:     13.80 cm/s TAPSE (M-mode): 2.5 cm LEFT ATRIUM              Index        RIGHT ATRIUM           Index LA diam:        4.40 cm  1.80 cm/m   RA Area:     22.50 cm LA Vol (A2C):   139.0 ml 56.74 ml/m  RA Volume:   72.70 ml  29.68 ml/m LA Vol (A4C):   83.1 ml  33.92 ml/m LA Biplane Vol: 113.0 ml 46.13 ml/m  AORTIC VALVE LVOT Vmax:   109.00 cm/s LVOT Vmean:  72.200 cm/s LVOT VTI:    0.248 m  AORTA Ao Root diam: 4.00 cm MITRAL VALVE MV Area (PHT): 3.48 cm     SHUNTS MV Decel Time: 218 msec    Systemic VTI:  0.25 m MV E velocity: 75.30 cm/s  Systemic Diam: 2.30 cm MV A velocity: 65.80 cm/s MV E/A ratio:  1.14 Vishnu Priya Mallipeddi Electronically signed by Winfield Rast Mallipeddi Signature Date/Time: 01/09/2023/3:59:11 PM    Final    DG Chest Port 1 View  Result Date: 01/09/2023 CLINICAL DATA:  Shortness of breath. EXAM: PORTABLE CHEST 1 VIEW COMPARISON:  December 31, 2022 FINDINGS: The heart size and mediastinal contours are within normal limits. Mild, stable atelectasis is seen within the left lung base. There is no evidence of a pleural effusion or pneumothorax. The visualized skeletal structures are unremarkable. IMPRESSION: Stable exam without acute cardiopulmonary disease. Electronically Signed   By: Aram Candela M.D.   On: 01/09/2023 02:50        Scheduled Meds:  amLODipine  5 mg Oral Daily   aspirin EC  81 mg Oral Daily   enoxaparin (LOVENOX) injection  40 mg Subcutaneous Q24H   gabapentin  300 mg Oral TID   insulin aspart  0-9 Units Subcutaneous TID WC  irbesartan  300 mg Oral Daily   isosorbide mononitrate  30 mg Oral Daily   levothyroxine  75 mcg Oral Q0600   pantoprazole  80 mg Oral Daily   Continuous Infusions:   LOS: 0 days    Time spent: 45 minutes spent on chart review, discussion with nursing staff, consultants, updating family and interview/physical exam; more than 50% of that time was spent in counseling and/or coordination of care.    Joseph Art, DO [facility] Available via Epic secure chat 7am-7pm After these hours, please refer to coverage provider listed on [email or web address] 01/10/2023, 11:54 AM

## 2023-01-10 NOTE — Care Management (Addendum)
  Transition of Care [facility]) Screening Note   Patient Details  Name: Keith Solis Date of Birth: 1956-06-07   Transition of Care [facility]) CM/SW Contact:    Gala Lewandowsky, RN Phone Number: 01/10/2023, 11:00 AM    Transition of Care Department [facility]) has reviewed the patient and no TOC needs have been identified at this time. Patient is a transfer from [facility] for chest pain. PTA patient was independent from home with spouse. We will continue to monitor patient advancement through interdisciplinary progression rounds. If new patient transition needs arise, please place a TOC consult.   1104 Case Manager received consult for outpatient sleep study-unable to arrange sleep studies. Patient is being followed by [facility] and attending to speak with cardiology to arrange for outpatient. No further needs identified.

## 2023-01-10 NOTE — Care Management Obs Status (Signed)
MEDICARE OBSERVATION STATUS NOTIFICATION   Patient Details  Name: Keith Solis MRN: [id number] Date of Birth: September 22, 1955   Medicare Observation Status Notification Given:  Yes    Gala Lewandowsky, RN 01/10/2023, 10:59 AM

## 2023-01-10 NOTE — Progress Notes (Addendum)
Rounding Note    Patient Name: Keith Solis Date of Encounter: 01/10/2023  [facility] Cardiologist: Christell Constant, MD   Subjective   Patient reports intermittent chest pain for 3 weeks, mostly with exertion, symptoms worsened yesterday and he went to ER. He had resolved pain from ASA and nitro, he is pain free now. He denied any SOB, leg edema. He does not smoking or drink ETOH.   Inpatient Medications    Scheduled Meds:  amLODipine  5 mg Oral Daily   aspirin EC  81 mg Oral Daily   enoxaparin (LOVENOX) injection  40 mg Subcutaneous Q24H   gabapentin  300 mg Oral TID   insulin aspart  0-9 Units Subcutaneous TID WC   irbesartan  300 mg Oral Daily   isosorbide mononitrate  30 mg Oral Daily   levothyroxine  75 mcg Oral Q0600   pantoprazole  80 mg Oral Daily   Continuous Infusions:  PRN Meds: acetaminophen **OR** acetaminophen, nitroGLYCERIN, ondansetron **OR** ondansetron (ZOFRAN) IV   Vital Signs    Vitals:   01/10/23 0008 01/10/23 0118 01/10/23 0419 01/10/23 0754  BP: 130/72 113/76 (!) 104/59 120/72  Pulse: 65 65  76  Resp: 18 18 18 17   Temp:  97.6 F (36.4 C) 97.6 F (36.4 C) 98.4 F (36.9 C)  TempSrc:  Oral Oral Oral  SpO2: 96% 96%  94%  Weight:  123.7 kg    Height:  6' (1.829 m)     No intake or output data in the 24 hours ending 01/10/23 0912    01/10/2023    1:18 AM 01/09/2023    2:02 AM 01/02/2023   10:10 AM  Last 3 Weights  Weight (lbs) 272 lb 9.6 oz 277 lb 12.5 oz 277 lb  Weight (kg) 123.651 kg 126 kg 125.646 kg      Telemetry    Sinus rhythm 70s with occasional PVCs  - Personally Reviewed  ECG    N/A today - Personally Reviewed  Physical Exam   GEN: No acute distress.   Neck: No JVD Cardiac: RRR, no murmurs, rubs, or gallops.  Respiratory: Clear to auscultation bilaterally. On room air. Speaks full sentence  GI: Soft, abdomen obese  MS: No leg edema; No deformity. Neuro:  Nonfocal  Psych: Normal affect   Labs     High Sensitivity Troponin:   Recent Labs  Lab 12/31/22 1728 01/09/23 0207 01/09/23 0415 01/09/23 1421 01/09/23 1645  TROPONINIHS 8 8 7 6 5      Chemistry Recent Labs  Lab 01/09/23 0207 01/09/23 0415 01/09/23 1024 01/10/23 0248  NA 139  --   --  138  K 4.0  --   --  3.7  CL 102  --   --  100  CO2 30  --   --  27  GLUCOSE 113*  --   --  101*  BUN 12  --   --  11  CREATININE 1.11  --   --  1.13  CALCIUM 8.9  --   --  8.8*  MG  --  1.9  --  1.9  PROT  --   --  6.9 6.3*  ALBUMIN  --   --  3.8 3.5  AST  --   --  13* 17  ALT  --   --  14 14  ALKPHOS  --   --  61 59  BILITOT  --   --  1.3* 0.8  GFRNONAA >60  --   --  >  60  ANIONGAP 7  --   --  11    Lipids  Recent Labs  Lab 01/10/23 0248  CHOL 74  TRIG 71  HDL 27*  LDLCALC 33  CHOLHDL 2.7    Hematology Recent Labs  Lab 01/09/23 0207 01/10/23 0248  WBC 14.4* 12.8*  RBC 5.85* 5.43  HGB 16.9 15.3  HCT 51.6 47.7  MCV 88.2 87.8  MCH 28.9 28.2  MCHC 32.8 32.1  RDW 13.7 13.8  PLT 231 241   Thyroid  Recent Labs  Lab 01/09/23 0415 01/09/23 1024  TSH 5.999*  --   FREET4  --  0.98    BNPNo results for input(s): "BNP", "PROBNP" in the last 168 hours.  DDimer No results for input(s): "DDIMER" in the last 168 hours.   Radiology    ECHOCARDIOGRAM COMPLETE  Result Date: 01/09/2023    ECHOCARDIOGRAM REPORT   Patient Name:   Keith Solis Date of Exam: 01/09/2023 Medical Rec #:  [id number]       Height:       72.0 in Accession #:    [id number]      Weight:       277.8 lb Date of Birth:  February 26, 1956       BSA:          2.450 m Patient Age:    66 years        BP:           113/71 mmHg Patient Gender: M               HR:           71 bpm. [facility] Procedure: 2D Echo, Cardiac Doppler and Color Doppler Indications:    Chest Pain R07.9  History:        Patient has prior history of Echocardiogram examinations, most                 recent 04/15/2022. Arrythmias:PVC; Risk Factors:Hypertension. Hx                  of GERD.  Sonographer:    Celesta Gentile RCS Referring Phys: [id number] DAYNA N DUNN IMPRESSIONS  1. Left ventricular ejection fraction, by estimation, is 50 to 55%. The left ventricle has low normal function. Left ventricular endocardial border not optimally defined to evaluate regional wall motion. There is mild left ventricular hypertrophy. Left ventricular diastolic parameters are indeterminate.  2. Right ventricular systolic function is normal. The right ventricular size is normal. Tricuspid regurgitation signal is inadequate for assessing PA pressure.  3. Left atrial size was moderately dilated.  4. The mitral valve is grossly normal. No evidence of mitral valve regurgitation. No evidence of mitral stenosis.  5. The aortic valve is tricuspid. Aortic valve regurgitation is not visualized. No aortic stenosis is present.  6. Aortic dilatation noted. There is mild dilatation of the aortic root, measuring 40 mm.  7. The inferior vena cava is dilated in size with >50% respiratory variability, suggesting right atrial pressure of 8 mmHg. Comparison(s): No significant change from prior study. FINDINGS  Left Ventricle: Left ventricular ejection fraction, by estimation, is 50 to 55%. The left ventricle has low normal function. Left ventricular endocardial border not optimally defined to evaluate regional wall motion. The left ventricular internal cavity  size was normal in size. There is mild left ventricular hypertrophy. Left ventricular diastolic parameters are indeterminate. Right Ventricle: The right ventricular size is normal. No increase in  right ventricular wall thickness. Right ventricular systolic function is normal. Tricuspid regurgitation signal is inadequate for assessing PA pressure. Left Atrium: Left atrial size was moderately dilated. Right Atrium: Right atrial size was normal in size. Pericardium: There is no evidence of pericardial effusion. Mitral Valve: The mitral valve is grossly normal. No evidence of  mitral valve regurgitation. No evidence of mitral valve stenosis. Tricuspid Valve: The tricuspid valve is grossly normal. Tricuspid valve regurgitation is not demonstrated. No evidence of tricuspid stenosis. Aortic Valve: The aortic valve is tricuspid. Aortic valve regurgitation is not visualized. No aortic stenosis is present. Pulmonic Valve: The pulmonic valve was not well visualized. Pulmonic valve regurgitation is not visualized. No evidence of pulmonic stenosis. Aorta: Aortic dilatation noted. There is mild dilatation of the aortic root, measuring 40 mm. Venous: The inferior vena cava is dilated in size with greater than 50% respiratory variability, suggesting right atrial pressure of 8 mmHg. IAS/Shunts: No atrial level shunt detected by color flow Doppler.  LEFT VENTRICLE PLAX 2D LVIDd:         6.10 cm   Diastology LVIDs:         4.20 cm   LV e' medial:    6.20 cm/s LV PW:         1.30 cm   LV E/e' medial:  12.1 LV IVS:        1.20 cm   LV e' lateral:   10.90 cm/s LVOT diam:     2.30 cm   LV E/e' lateral: 6.9 LV SV:         103 LV SV Index:   42 LVOT Area:     4.15 cm  RIGHT VENTRICLE RV S prime:     13.80 cm/s TAPSE (M-mode): 2.5 cm LEFT ATRIUM              Index        RIGHT ATRIUM           Index LA diam:        4.40 cm  1.80 cm/m   RA Area:     22.50 cm LA Vol (A2C):   139.0 ml 56.74 ml/m  RA Volume:   72.70 ml  29.68 ml/m LA Vol (A4C):   83.1 ml  33.92 ml/m LA Biplane Vol: 113.0 ml 46.13 ml/m  AORTIC VALVE LVOT Vmax:   109.00 cm/s LVOT Vmean:  72.200 cm/s LVOT VTI:    0.248 m  AORTA Ao Root diam: 4.00 cm MITRAL VALVE MV Area (PHT): 3.48 cm    SHUNTS MV Decel Time: 218 msec    Systemic VTI:  0.25 m MV E velocity: 75.30 cm/s  Systemic Diam: 2.30 cm MV A velocity: 65.80 cm/s MV E/A ratio:  1.14 Vishnu Priya Mallipeddi Electronically signed by Winfield Rast Mallipeddi Signature Date/Time: 01/09/2023/3:59:11 PM    Final    DG Chest Port 1 View  Result Date: 01/09/2023 CLINICAL DATA:  Shortness of  breath. EXAM: PORTABLE CHEST 1 VIEW COMPARISON:  December 31, 2022 FINDINGS: The heart size and mediastinal contours are within normal limits. Mild, stable atelectasis is seen within the left lung base. There is no evidence of a pleural effusion or pneumothorax. The visualized skeletal structures are unremarkable. IMPRESSION: Stable exam without acute cardiopulmonary disease. Electronically Signed   By: Aram Candela M.D.   On: 01/09/2023 02:50    Cardiac Studies   Echo from 01/09/23:   1. Left ventricular ejection fraction, by estimation, is 50 to 55%. The  left ventricle has low normal function. Left ventricular endocardial  border not optimally defined to evaluate regional wall motion. There is  mild left ventricular hypertrophy. Left  ventricular diastolic parameters are indeterminate.   2. Right ventricular systolic function is normal. The right ventricular  size is normal. Tricuspid regurgitation signal is inadequate for assessing  PA pressure.   3. Left atrial size was moderately dilated.   4. The mitral valve is grossly normal. No evidence of mitral valve  regurgitation. No evidence of mitral stenosis.   5. The aortic valve is tricuspid. Aortic valve regurgitation is not  visualized. No aortic stenosis is present.   6. Aortic dilatation noted. There is mild dilatation of the aortic root,  measuring 40 mm.   7. The inferior vena cava is dilated in size with >50% respiratory  variability, suggesting right atrial pressure of 8 mmHg.   Patient Profile     68 y.o. male with PMH of pre-diabetes, HTN, hypothyroidism, GERD, orthostatic hypotension, neuropathy, who presented with chest pain to [facility] ER, transferred to [facility] for further workup. Cardiology is following.   Assessment & Plan    Chest pain - presented with 3 weeks of intermittent exertional chest pain that worsened on 01/09/23  - Hs trop negative x4 - EKG without acute ischemic findings  - LDL 33 - Echo 01/09/23 showed LVEF  50-55% low normal, difficult assess RWMA, mild LVH, normal RV, mod LAE, mild dilatation of the aortic root 40 mm  - ACS has ruled out  - plan for CT coronary study today  - Medical therapy recommendation to follow post cath, continue ASA, imdur,  and PRN nitro for now   HTN - BP controlled, continue amlodipine and irbesartan and imdur   Nocturnal hypoxia - need outpatient sleep study for OSA evaluation, at follow up visit, please arrange   Hypothyroidism GERD Pre-diabetes - per primary team    or questions or updates, please contact [facility] Please consult [email or web address] for contact info under     Signed, Cyndi Bender, NP  01/10/2023, 9:12 AM    Patient seen and examined, note reviewed with the signed Advanced Practice Provider. I personally reviewed laboratory data, imaging studies and relevant notes. I independently examined the patient and formulated the important aspects of the plan. I have personally discussed the plan with the patient and/or family. Comments or changes to the note/plan are indicated below.  Plan for CCTA today   Thomasene Ripple DO, MS [facility] Attending Cardiologist [facility]  [address] [place], [place] [address] [phone] Website: [email or web address]

## 2023-01-11 ENCOUNTER — Encounter (HOSPITAL_COMMUNITY): Payer: Medicare PPO

## 2023-01-11 DIAGNOSIS — R079 Chest pain, unspecified: Secondary | ICD-10-CM | POA: Diagnosis not present

## 2023-01-11 LAB — GLUCOSE, CAPILLARY: Glucose-Capillary: 114 mg/dL — ABNORMAL HIGH (ref 70–99)

## 2023-01-11 NOTE — Progress Notes (Signed)
   Reviewed Coronary CT from yesterday- showed a coronary calcium score of 0 Agatston units, suggesting low risk for further cardiac events. There was no significant coronary disease noted.   I explained study results to patient and all questions were answered. Patient has an appointment with Sharlene Dory NP on 5/7 at 2:30 PM. Patient OK for discharge from a cardiology standpoint.   Jonita Albee, PA-C 01/11/2023 9:54 AM

## 2023-01-11 NOTE — Discharge Instructions (Signed)
Advised to follow-up with primary care physician in 1 week. Advised to follow-up with cardiology as scheduled.   Patient has no coronary artery disease.  CTA chest was negative.  CAT score 0

## 2023-01-11 NOTE — Progress Notes (Signed)
Pt removed tele box and refuses to place back on since he was told that he would be discharging today.

## 2023-01-11 NOTE — Plan of Care (Signed)

## 2023-01-11 NOTE — Discharge Summary (Signed)
Physician Discharge Summary  Keith CHANDRAN Solis:[id number] DOB: [birth year] DOA: 01/09/2023  PCP: Benita Stabile, MD  Admit date: 01/09/2023  Discharge date: 01/11/2023  Admitted From: Home  Disposition:  Home  Recommendations for Outpatient Follow-up:  Follow up with PCP in 1-2 weeks. Please obtain BMP/CBC in one week. Advised to follow-up with cardiology as scheduled.   Patient has no coronary artery disease.  CTC  was negative.  CAT score 0  Home Health:None Equipment/Devices:None  Discharge Condition: Stable CODE STATUS:Full code Diet recommendation: Heart Healthy   Brief [facility] Course: This 67 yrs old Male who presented in the ED with intermittent chest pain for 3 weeks, mostly with exertion, symptoms worsened yesterday and he presented to the ED. Chest pain had resolved with ASA and nitro, he is pain free now. He denied any SOB, leg edema. He does not smoking or drink ETOH. Cardiology was consulted,  Echo was done , CT coronary was completed, CAT score 0.0, echocardiogram shows LVEF 60 to 65%.  Patient denies any chest pain.  Cardiology signed off.  Patient being discharged home.   Discharge Diagnoses:  Principal Problem:   Chest pain Active Problems:   Essential hypertension   Acquired hypothyroidism   GERD without esophagitis   Obesity (BMI 30-39.9)   Leukocytosis   Precordial chest pain  Chest pain Cardiology consulted. Coronary CT score 0.  Patient does not have a coronary artery disease. Echocardiogram LVEF within normal limits.   Suspected sleep apnea Patient needs outpatient sleep studies.   Leukocytosis possibly reactive -trending down   Essential hypertension Continue amlodipine, Avapro   Acquired hypothyroidism Continue Synthroid   GERD Continue Protonix   Obesity  Estimated body mass index is 36.97 kg/m as calculated from the following:   Height as of this encounter: 6' (1.829 m).   Weight as of this encounter: 123.7 kg.      Discharge Instructions  Discharge Instructions     Call MD for:  difficulty breathing, headache or visual disturbances   Complete by: As directed    Call MD for:  persistant dizziness or light-headedness   Complete by: As directed    Call MD for:  persistant nausea and vomiting   Complete by: As directed    Diet - low sodium heart healthy   Complete by: As directed    Diet Carb Modified   Complete by: As directed    Discharge instructions   Complete by: As directed    Advised to follow-up with primary care physician in 1 week. Advised to follow-up with cardiology as scheduled.   Patient has no coronary artery disease.  CTA chest was negative.  CAT score 0   Increase activity slowly   Complete by: As directed       Allergies as of 01/11/2023   No Known Allergies      Medication List     TAKE these medications    albuterol 108 (90 Base) MCG/ACT inhaler Commonly known as: VENTOLIN HFA Inhale 2 puffs into the lungs every 6 (six) hours as needed for wheezing or shortness of breath.   amLODipine 5 MG tablet Commonly known as: NORVASC Take 5 mg by mouth daily.   aspirin EC 81 MG tablet Take 81 mg by mouth daily. Swallow whole.   gabapentin 300 MG capsule Commonly known as: NEURONTIN Take 300 mg by mouth 3 (three) times daily.   levothyroxine 75 MCG tablet Commonly known as: SYNTHROID Take 75 mcg by mouth daily before breakfast.  olmesartan 40 MG tablet Commonly known as: BENICAR Take 1 tablet (40 mg total) by mouth daily.   omeprazole 40 MG capsule Commonly known as: PRILOSEC Take 1 capsule by mouth twice daily   testosterone cypionate 200 MG/ML injection Commonly known as: DEPOTESTOSTERONE CYPIONATE Inject 200 mg into the muscle every 14 (fourteen) days.        Follow-up Information     Sharlene Dory, NP Follow up on 01/24/2023.   Specialty: Cardiology Why: Appointment at 2:30 PM Contact information: [address] [place]  [address] [phone]         Benita Stabile, MD Follow up in 1 week(s).   Specialty: Internal Medicine Contact information: [address] [place] [address] [phone]                No Known Allergies  Consultations: Cardiology   Procedures/Studies: CT CORONARY MORPH W/CTA COR W/SCORE W/CA W/CM &/OR WO/CM  Addendum Date: 01/10/2023   ADDENDUM REPORT: 01/10/2023 21:21 EXAM: OVER-READ INTERPRETATION  CT CHEST The following report is an over-read performed by radiologist Dr. Leda Min [facility] on 01/10/2023. This over-read does not include interpretation of cardiac or coronary anatomy or pathology. The coronary CTA interpretation by the cardiologist is attached. COMPARISON:  12/31/2022 FINDINGS: No pericardial effusion. Visualized portions of thoracic aorta unremarkable. No pleural effusion. No pneumothorax. Visualized portions of lung fields clear. IMPRESSION: Negative Electronically Signed   By: Corlis Leak M.D.   On: 01/10/2023 21:21   Result Date: 01/10/2023 CLINICAL DATA:  Chest pain EXAM: Cardiac CTA MEDICATIONS: Sub lingual nitro. 4mg  x 2 TECHNIQUE: The patient was scanned on a Siemens 192 slice scanner. Gantry rotation speed was 250 msecs. Collimation was 0.6 mm. A 100 kV prospective scan was triggered in the ascending thoracic aorta at 35-75% of the R-R interval. Average HR during the scan was 60 bpm. The 3D data set was interpreted on a dedicated work station using MPR, MIP and VRT modes. A total of 80cc of contrast was used. FINDINGS: Non-cardiac: See separate report from [facility]. Technically difficult study with poor bolus timing and misregistration artifact. No LA appendage thrombus. Pulmonary veins drain normally to the left atrium. Calcium Score: 0 Agatston units Coronary Arteries: Right dominant with no anomalies LM: No plaque or stenosis. LAD system: No plaque or stenosis. Circumflex system: No plaque or stenosis. RCA system: Distal  RCA evaluation limited by artifact, but probably no plaque or stenosis. IMPRESSION: 1.  Technically difficult study. 2. Coronary artery calcium score 0 Agatston units, suggesting low risk for future cardiac events. 3.  No significant coronary disease noted. 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 On: 01/10/2023 21:05   ECHOCARDIOGRAM COMPLETE  Result Date: 01/09/2023    ECHOCARDIOGRAM REPORT   Patient Name:   Keith Solis Date of Exam: 01/09/2023 Medical Rec #:  [id number]       Height:       72.0 in Accession #:    [id number]      Weight:       277.8 lb Date of Birth:  01-10-1956       BSA:          2.450 m Patient Age:    66 years        BP:           113/71 mmHg Patient Gender: M               HR:  71 bpm. [facility] Procedure: 2D Echo, Cardiac Doppler and Color Doppler Indications:    Chest Pain R07.9  History:        Patient has prior history of Echocardiogram examinations, most                 recent 04/15/2022. Arrythmias:PVC; Risk Factors:Hypertension. Hx                 of GERD.  Sonographer:    Celesta Gentile RCS Referring Phys: [phone] DAYNA N DUNN IMPRESSIONS  1. Left ventricular ejection fraction, by estimation, is 50 to 55%. The left ventricle has low normal function. Left ventricular endocardial border not optimally defined to evaluate regional wall motion. There is mild left ventricular hypertrophy. Left ventricular diastolic parameters are indeterminate.  2. Right ventricular systolic function is normal. The right ventricular size is normal. Tricuspid regurgitation signal is inadequate for assessing PA pressure.  3. Left atrial size was moderately dilated.  4. The mitral valve is grossly normal. No evidence of mitral valve regurgitation. No evidence of mitral stenosis.  5. The aortic valve is tricuspid. Aortic valve regurgitation is not visualized. No aortic stenosis is present.  6. Aortic dilatation noted. There is mild dilatation of the aortic root,  measuring 40 mm.  7. The inferior vena cava is dilated in size with >50% respiratory variability, suggesting right atrial pressure of 8 mmHg. Comparison(s): No significant change from prior study. FINDINGS  Left Ventricle: Left ventricular ejection fraction, by estimation, is 50 to 55%. The left ventricle has low normal function. Left ventricular endocardial border not optimally defined to evaluate regional wall motion. The left ventricular internal cavity  size was normal in size. There is mild left ventricular hypertrophy. Left ventricular diastolic parameters are indeterminate. Right Ventricle: The right ventricular size is normal. No increase in right ventricular wall thickness. Right ventricular systolic function is normal. Tricuspid regurgitation signal is inadequate for assessing PA pressure. Left Atrium: Left atrial size was moderately dilated. Right Atrium: Right atrial size was normal in size. Pericardium: There is no evidence of pericardial effusion. Mitral Valve: The mitral valve is grossly normal. No evidence of mitral valve regurgitation. No evidence of mitral valve stenosis. Tricuspid Valve: The tricuspid valve is grossly normal. Tricuspid valve regurgitation is not demonstrated. No evidence of tricuspid stenosis. Aortic Valve: The aortic valve is tricuspid. Aortic valve regurgitation is not visualized. No aortic stenosis is present. Pulmonic Valve: The pulmonic valve was not well visualized. Pulmonic valve regurgitation is not visualized. No evidence of pulmonic stenosis. Aorta: Aortic dilatation noted. There is mild dilatation of the aortic root, measuring 40 mm. Venous: The inferior vena cava is dilated in size with greater than 50% respiratory variability, suggesting right atrial pressure of 8 mmHg. IAS/Shunts: No atrial level shunt detected by color flow Doppler.  LEFT VENTRICLE PLAX 2D LVIDd:         6.10 cm   Diastology LVIDs:         4.20 cm   LV e' medial:    6.20 cm/s LV PW:         1.30 cm    LV E/e' medial:  12.1 LV IVS:        1.20 cm   LV e' lateral:   10.90 cm/s LVOT diam:     2.30 cm   LV E/e' lateral: 6.9 LV SV:         103 LV SV Index:   42 LVOT Area:  4.15 cm  RIGHT VENTRICLE RV S prime:     13.80 cm/s TAPSE (M-mode): 2.5 cm LEFT ATRIUM              Index        RIGHT ATRIUM           Index LA diam:        4.40 cm  1.80 cm/m   RA Area:     22.50 cm LA Vol (A2C):   139.0 ml 56.74 ml/m  RA Volume:   72.70 ml  29.68 ml/m LA Vol (A4C):   83.1 ml  33.92 ml/m LA Biplane Vol: 113.0 ml 46.13 ml/m  AORTIC VALVE LVOT Vmax:   109.00 cm/s LVOT Vmean:  72.200 cm/s LVOT VTI:    0.248 m  AORTA Ao Root diam: 4.00 cm MITRAL VALVE MV Area (PHT): 3.48 cm    SHUNTS MV Decel Time: 218 msec    Systemic VTI:  0.25 m MV E velocity: 75.30 cm/s  Systemic Diam: 2.30 cm MV A velocity: 65.80 cm/s MV E/A ratio:  1.14 Vishnu Priya Mallipeddi Electronically signed by Winfield Rast Mallipeddi Signature Date/Time: 01/09/2023/3:59:11 PM    Final    DG Chest Port 1 View  Result Date: 01/09/2023 CLINICAL DATA:  Shortness of breath. EXAM: PORTABLE CHEST 1 VIEW COMPARISON:  December 31, 2022 FINDINGS: The heart size and mediastinal contours are within normal limits. Mild, stable atelectasis is seen within the left lung base. There is no evidence of a pleural effusion or pneumothorax. The visualized skeletal structures are unremarkable. IMPRESSION: Stable exam without acute cardiopulmonary disease. Electronically Signed   By: Aram Candela M.D.   On: 01/09/2023 02:50   CT Angio Chest/Abd/Pel for Dissection W and/or Wo Contrast  Result Date: 12/31/2022 CLINICAL DATA:  Acute aortic syndrome suspected. Left-sided chest pain radiating into the arm. EXAM: CT ANGIOGRAPHY CHEST, ABDOMEN AND PELVIS TECHNIQUE: Non-contrast CT of the chest was initially obtained. Multidetector CT imaging through the chest, abdomen and pelvis was performed using the standard protocol during bolus administration of intravenous contrast.  Multiplanar reconstructed images and MIPs were obtained and reviewed to evaluate the vascular anatomy. RADIATION DOSE REDUCTION: This exam was performed according to the departmental dose-optimization program which includes automated exposure control, adjustment of the mA and/or kV according to patient size and/or use of iterative reconstruction technique. CONTRAST:  OMNIPAQUE IOHEXOL 350 MG/ML SOLN COMPARISON:  None Available. FINDINGS: CTA CHEST FINDINGS Cardiovascular: Conventional 3 vessel arch anatomy. No evidence of aneurysm or dissection. No significant atherosclerotic plaque. The heart is normal in size. No pericardial effusion. Mediastinum/Nodes: Unremarkable CT appearance of the thyroid gland. No suspicious mediastinal or hilar adenopathy. No soft tissue mediastinal mass. The thoracic esophagus is unremarkable. Lungs/Pleura: Lungs are clear. No pleural effusion or pneumothorax. Musculoskeletal: No acute fracture or aggressive appearing lytic or blastic osseous lesion. Review of the MIP images confirms the above findings. CTA ABDOMEN AND PELVIS FINDINGS VASCULAR Aorta: Normal caliber aorta without aneurysm, dissection, vasculitis or significant stenosis. Celiac: Patent without evidence of aneurysm, dissection, vasculitis or significant stenosis. SMA: Patent without evidence of aneurysm, dissection, vasculitis or significant stenosis. Renals: Both renal arteries are patent without evidence of aneurysm, dissection, vasculitis, fibromuscular dysplasia or significant stenosis. IMA: Patent without evidence of aneurysm, dissection, vasculitis or significant stenosis. Inflow: Patent without evidence of aneurysm, dissection, vasculitis or significant stenosis. Veins: No obvious venous abnormality within the limitations of this arterial phase study. Review of the MIP images confirms the above findings. NON-VASCULAR  Hepatobiliary: No focal liver abnormality is seen. Status post cholecystectomy. No biliary  dilatation. Pancreas: Unremarkable. No pancreatic ductal dilatation or surrounding inflammatory changes. Spleen: No splenic injury or perisplenic hematoma. Adrenals/Urinary Tract: Small right adrenal nodule measures up to 1.3 cm. Small left adrenal nodule measures up to 1.4 cm. Neither meets attenuation criteria for simple adenoma on this single-phase study. Please note that both lesions are incompletely evaluated. In the absence of known metastatic disease, benign adenomas are strongly favored. No imaging follow-up recommended. No hydronephrosis, nephrolithiasis or enhancing renal mass. Ureters and bladder are unremarkable. Stomach/Bowel: Stomach is within normal limits. Appendix appears normal. No evidence of bowel wall thickening, distention, or inflammatory changes. Lymphatic: No suspicious lymphadenopathy. Reproductive: Mild prostatomegaly. Other: Surgical changes of prior laparoscopic right inguinal hernia repair. No free fluid. No recurrent hernia. Musculoskeletal: No acute fracture or aggressive appearing lytic or blastic osseous lesion. Focal L5-S1 degenerative disc disease. Review of the MIP images confirms the above findings. IMPRESSION: 1. Negative for acute aortic dissection, aneurysm or other acute vascular abnormality. 2. No acute cardiopulmonary process. 3. No acute abnormality within the abdomen or pelvis. 4. Prostatomegaly. 5. Surgical changes of prior laparoscopic right inguinal hernia repair. 6. L5-S1 degenerative disc disease. Electronically Signed   By: Malachy Moan M.D.   On: 12/31/2022 16:20   DG Chest Portable 1 View  Result Date: 12/31/2022 CLINICAL DATA:  Chest pain. EXAM: PORTABLE CHEST 1 VIEW COMPARISON:  Chest x-ray dated April 15, 2022. FINDINGS: The heart size and mediastinal contours are within normal limits. Both lungs are clear. The visualized skeletal structures are unremarkable. IMPRESSION: No active disease. Electronically Signed   By: Obie Dredge M.D.   On:  12/31/2022 15:49      Subjective: Patient was seen and examined at bedside.  Overnight events noted.   Patient denies any chest pain,  wants to be discharged.  Workup so far negative.   Patient is being discharged home.  Discharge Exam: Vitals:   01/11/23 0336 01/11/23 0851  BP: 137/77 (!) 146/85  Pulse: 60   Resp: 14   Temp: 97.6 F (36.4 C)   SpO2: 98%    Vitals:   01/10/23 1213 01/10/23 2022 01/11/23 0336 01/11/23 0851  BP: 128/80 126/67 137/77 (!) 146/85  Pulse: 65 61 60   Resp: Temp: 98.7 F (37.1 C) 98.7 F (37.1 C) 97.6 F (36.4 C)   TempSrc: Oral Oral Oral   SpO2:  97% 98%   Weight:      Height:        General: Pt is alert, awake, not in acute distress Cardiovascular: RRR, S1/S2 +, no rubs, no gallops Respiratory: CTA bilaterally, no wheezing, no rhonchi Abdominal: Soft, NT, ND, bowel sounds + Extremities: no edema, no cyanosis    The results of significant diagnostics from this hospitalization (including imaging, microbiology, ancillary and laboratory) are listed below for reference.     Microbiology: No results found for this or any previous visit (from the past 240 hour(s)).   Labs: BNP (last 3 results) No results for input(s): "BNP" in the last 8760 hours. Basic Metabolic Panel: Recent Labs  Lab 01/09/23 0207 01/09/23 0415 01/10/23 0248  NA 139  --  138  K 4.0  --  3.7  CL 102  --  100  CO2 30  --  27  GLUCOSE 113*  --  101*  BUN 12  --  11  CREATININE 1.11  --  1.13  CALCIUM  8.9  --  8.8*  MG  --  1.9 1.9  PHOS  --  2.6  --    Liver Function Tests: Recent Labs  Lab 01/09/23 1024 01/10/23 0248  AST 13* 17  ALT 14 14  ALKPHOS 61 59  BILITOT 1.3* 0.8  PROT 6.9 6.3*  ALBUMIN 3.8 3.5   No results for input(s): "LIPASE", "AMYLASE" in the last 168 hours. No results for input(s): "AMMONIA" in the last 168 hours. CBC: Recent Labs  Lab 01/09/23 0207 01/10/23 0248  WBC 14.4* 12.8*  HGB 16.9 15.3  HCT 51.6 47.7  MCV  88.2 87.8  PLT 231 241   Cardiac Enzymes: No results for input(s): "CKTOTAL", "CKMB", "CKMBINDEX", "TROPONINI" in the last 168 hours. BNP: Invalid input(s): "POCBNP" CBG: Recent Labs  Lab 01/10/23 0757 01/10/23 1215 01/10/23 1640 01/10/23 2138 01/11/23 0741  GLUCAP 77 99 90 134* 114*   D-Dimer No results for input(s): "DDIMER" in the last 72 hours. Hgb A1c Recent Labs    01/09/23 0415  HGBA1C 5.9*   Lipid Profile Recent Labs    01/10/23 0248  CHOL 74  HDL 27*  LDLCALC 33  TRIG 71  CHOLHDL 2.7   Thyroid function studies Recent Labs    01/09/23 0415  TSH 5.999*   Anemia work up No results for input(s): "VITAMINB12", "FOLATE", "FERRITIN", "TIBC", "IRON", "RETICCTPCT" in the last 72 hours. Urinalysis    Component Value Date/Time   COLORURINE YELLOW 04/15/2022 1044   APPEARANCEUR HAZY (A) 04/15/2022 1044   LABSPEC 1.016 04/15/2022 1044   PHURINE 5.0 04/15/2022 1044   GLUCOSEU NEGATIVE 04/15/2022 1044   HGBUR NEGATIVE 04/15/2022 1044   BILIRUBINUR NEGATIVE 04/15/2022 1044   KETONESUR NEGATIVE 04/15/2022 1044   PROTEINUR 30 (A) 04/15/2022 1044   UROBILINOGEN 0.2 09/14/2010 0114   NITRITE NEGATIVE 04/15/2022 1044   LEUKOCYTESUR NEGATIVE 04/15/2022 1044   Sepsis Labs Recent Labs  Lab 01/09/23 0207 01/10/23 0248  WBC 14.4* 12.8*   Microbiology No results found for this or any previous visit (from the past 240 hour(s)).   Time coordinating discharge: Over 30 minutes  SIGNED:   Willeen Niece, MD  [facility] 01/11/2023, 11:08 AM Pager   If 7PM-7AM, please contact night-coverage

## 2023-01-12 ENCOUNTER — Encounter: Payer: Self-pay | Admitting: Internal Medicine

## 2023-01-19 DIAGNOSIS — R0789 Other chest pain: Secondary | ICD-10-CM | POA: Diagnosis not present

## 2023-01-19 DIAGNOSIS — R0683 Snoring: Secondary | ICD-10-CM | POA: Diagnosis not present

## 2023-01-20 DIAGNOSIS — E291 Testicular hypofunction: Secondary | ICD-10-CM | POA: Diagnosis not present

## 2023-01-20 DIAGNOSIS — N529 Male erectile dysfunction, unspecified: Secondary | ICD-10-CM | POA: Diagnosis not present

## 2023-01-24 ENCOUNTER — Encounter: Payer: Self-pay | Admitting: Nurse Practitioner

## 2023-01-24 ENCOUNTER — Ambulatory Visit: Payer: Medicare PPO | Attending: Nurse Practitioner | Admitting: Nurse Practitioner

## 2023-01-24 VITALS — BP 128/80 | HR 71 | Ht 72.0 in | Wt 275.0 lb

## 2023-01-24 DIAGNOSIS — I10 Essential (primary) hypertension: Secondary | ICD-10-CM | POA: Diagnosis not present

## 2023-01-24 DIAGNOSIS — R0609 Other forms of dyspnea: Secondary | ICD-10-CM | POA: Diagnosis not present

## 2023-01-24 DIAGNOSIS — E669 Obesity, unspecified: Secondary | ICD-10-CM | POA: Diagnosis not present

## 2023-01-24 DIAGNOSIS — M7989 Other specified soft tissue disorders: Secondary | ICD-10-CM | POA: Diagnosis not present

## 2023-01-24 DIAGNOSIS — G4733 Obstructive sleep apnea (adult) (pediatric): Secondary | ICD-10-CM

## 2023-01-24 DIAGNOSIS — G4719 Other hypersomnia: Secondary | ICD-10-CM | POA: Diagnosis not present

## 2023-01-24 DIAGNOSIS — R0683 Snoring: Secondary | ICD-10-CM

## 2023-01-24 DIAGNOSIS — G4734 Idiopathic sleep related nonobstructive alveolar hypoventilation: Secondary | ICD-10-CM | POA: Diagnosis not present

## 2023-01-24 MED ORDER — AMLODIPINE BESYLATE 5 MG PO TABS: 5.0000 mg | ORAL_TABLET | Freq: Every day | ORAL | 1 refills | Status: DC

## 2023-01-24 NOTE — Progress Notes (Unsigned)
Office Visit    Patient Name: Keith Solis Date of Encounter: 01/24/2023  PCP:  Benita Stabile, MD   [facility]  Cardiologist:  Christell Constant, MD  Advanced Practice Provider:  No care team member to display Electrophysiologist:  None   Chief Complaint    Keith Solis is a 67 y.o. male with a hx of HTN, orthostatic hypotension, neuropathy, former smoker, and hx of chest pain, who presents today for ED follow-up.   Past Medical History    Past Medical History:  Diagnosis Date   Diabetes mellitus (HCC)    Gastritis    GERD (gastroesophageal reflux disease)    Hiatal hernia    Hypertension    Neuropathy    Obesity    Schatzki's ring    Past Surgical History:  Procedure Laterality Date   BALLOON DILATION N/A 07/19/2022   Procedure: BALLOON DILATION;  Surgeon: Lanelle Bal, DO;  [facility];  Service: Endoscopy;  Laterality: N/A;   BIOPSY  07/19/2022   Procedure: BIOPSY;  Surgeon: Lanelle Bal, DO;  [facility];  Service: Endoscopy;;   cholecystectomy N/A    COLONOSCOPY WITH PROPOFOL N/A 07/19/2022   Procedure: COLONOSCOPY WITH PROPOFOL;  Surgeon: Lanelle Bal, DO;  [facility];  Service: Endoscopy;  Laterality: N/A;   ESOPHAGOGASTRODUODENOSCOPY (EGD) WITH PROPOFOL N/A 07/19/2022   Procedure: ESOPHAGOGASTRODUODENOSCOPY (EGD) WITH PROPOFOL;  Surgeon: Lanelle Bal, DO;  [facility];  Service: Endoscopy;  Laterality: N/A;   INGUINAL HERNIA REPAIR     POLYPECTOMY  07/19/2022   Procedure: POLYPECTOMY;  Surgeon: Lanelle Bal, DO;  [facility];  Service: Endoscopy;;   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 male with a PMH as mentioned above.   Had a prior episode of near syncope after pressure washing his house, took OTC supplement, and in AM work up with near  syncope and hypotension. Echo showed normal EF. Aortic dilatation was noted mildly along aortic root at 40 mm.   Last seen by Dr. Izora Ribas on 04/28/2022. Was doing well at the time. Recommended to repeat Echo in 1 year for aortic dilatation.  Most recently, presented to [facility] ED for chest discomfort with exertion, symptoms along left arm and behind shoulder blades. Noted mild swelling of legs, stable. No increased SHOB, noted chronic SHOB r/t drinking gasoline as child. Lab work overall unremarkable. CT of chest was negative for PE or aortic dissection or anything acute. Suggested/recommended to be admitted to hospital, pt refused. EKG and troponins were negative. Was told to follow-up with outpatient cardiology. Left AMA.   Today he presents for follow-up. He states he is doing well.  Has not had any recurrent chest discomfort since leaving the emergency department.  States it was recommended to him to have stress test performed outpatient.  Chronic, stable shortness of breath with exertion.  Denies any worsening shortness of breath, palpitations, syncope, presyncope, dizziness, orthopnea, PND, significant weight changes, acute bleeding, or claudication.  Stable, minimal lower extremity edema.  Wife presents concern for OSA during interview.  STOP-BANG score today 8.  EKGs/Labs/Other Studies Reviewed:   The following studies were reviewed today:   EKG:  EKG is not ordered today.    Echocardiogram 03/2022: 1. Left ventricular ejection fraction, by estimation, is 55 to 60%.  The  left ventricle has normal function. Left ventricular endocardial border  not optimally defined to evaluate regional wall motion, though no seen in  views obtained (cannot see  anterolated myocardial borders well). There is mild concentric left  ventricular hypertrophy. Left ventricular diastolic parameters are  indeterminate.   2. Right ventricular systolic function is normal. The right ventricular  size is normal.  Tricuspid regurgitation signal is inadequate for assessing  PA pressure.   3. Left atrial size was moderately dilated.   4. The mitral valve is normal in structure. No evidence of mitral valve  regurgitation. No evidence of mitral stenosis.   5. The aortic valve is tricuspid. Aortic valve regurgitation is not  visualized. No aortic stenosis is present.   6. Aortic dilatation noted. There is mild dilatation of the aortic root,  measuring 40 mm.   7. The inferior vena cava is normal in size with greater than 50%  respiratory variability, suggesting right atrial pressure of 3 mmHg.   Comparison(s): No prior Echocardiogram.  Recent Labs: 01/09/2023: TSH 5.999 01/10/2023: ALT 14; BUN 11; Creatinine, Ser 1.13; Hemoglobin 15.3; Magnesium 1.9; Platelets 241; Potassium 3.7; Sodium 138  Recent Lipid Panel    Component Value Date/Time   CHOL 74 01/10/2023 0248   TRIG 71 01/10/2023 0248   HDL 27 (L) 01/10/2023 0248   CHOLHDL 2.7 01/10/2023 0248   VLDL 14 01/10/2023 0248   LDLCALC 33 01/10/2023 0248    Home Medications   No outpatient medications have been marked as taking for the 01/24/23 encounter (Appointment) with Sharlene Dory, NP.     Review of Systems    All other systems reviewed and are otherwise negative except as noted above.  Physical Exam    VS:  There were no vitals taken for this visit. , BMI There is no height or weight on file to calculate BMI.  Wt Readings from Last 3 Encounters:  01/10/23 272 lb 9.6 oz (123.7 kg)  01/02/23 277 lb (125.6 kg)  12/31/22 270 lb (122.5 kg)     GEN: Obese, 67 year old male in no acute distress. HEENT: normal. Neck: Supple, no JVD, carotid bruits, or masses. Cardiac: S1/S2, RRR, no murmurs, rubs, or gallops. No clubbing, cyanosis.  Minimal, nonpitting edema along bilateral lower extremities.  Radials/PT 2+ and equal bilaterally.  Respiratory:  Respirations regular and unlabored, clear to auscultation bilaterally. MS: No deformity or  atrophy. Skin: Warm and dry, no rash. Neuro:  Strength and sensation are intact. Psych: Normal affect, fatigued.  Assessment & Plan    Chest pain of uncertain etiology Noted chest discomfort going into the left arm behind shoulder blades and along left side of chest with some exertion, denied any recurrent chest pain after leaving AMA in the ED.  Discussed ischemic evaluation, including ETT with risk and benefits, and he verbalized understanding is agreeable to proceed.  Troponins and EKG were unremarkable.  Continue current medication. Heart healthy diet and regular cardiovascular exercise encouraged.  ED precautions discussed.  Shared Decision Making/Informed Consent The risks [chest pain, shortness of breath, cardiac arrhythmias, dizziness, blood pressure fluctuations, myocardial infarction, stroke/transient ischemic attack, and life-threatening complications (estimated to be 1 in 10,000)], benefits (risk stratification, diagnosing coronary artery disease, treatment guidance) and alternatives of an exercise tolerance test were discussed in detail with Mr. Perniciaro and he agrees to proceed.   2.  Hypertension Blood pressure stable. Discussed to monitor BP at home at least 2 hours after medications and sitting for 5-10 minutes.  Continue  amlodipine and olmesartan. Heart healthy diet and regular cardiovascular exercise encouraged.   3. OSA screen, daytime sleepiness, snoring, DOE Stable, chronic DOE - has seen Dr. Sherene Sires with pulmonology in the past. STOP-BANG score 8 today. Discussed/recommended to call pulmonology office for in person evaluation.  Will route note to Dr. Sherene Sires.  Recommend arranging split-night sleep study for OSA evaluation at next office visit.  5. Obesity, leg swelling BMI 37.57.  Minimal/trace nonpitting edema along BLE.  Recommended continue wearing compression stockings. Weight loss via diet and exercise encouraged. Discussed the impact being overweight would have on  cardiovascular risk.  Disposition: Follow up in 6-8 week(s) with Christell Constant, MD or [facility].  Signed, Sharlene Dory, NP 01/24/2023, 12:00 PM [facility]

## 2023-01-24 NOTE — Patient Instructions (Addendum)
Medication Instructions:   Amlodipine refilled today. Continue all other medications.    Labwork:  none  Testing/Procedures:  none  Follow-Up:  Keep September appointment as scheduled   Any Other Special Instructions Will Be Listed Below (If Applicable). You have been referred to:  Pulmonology   If you need a refill on your cardiac medications before your next appointment, please call your pharmacy.

## 2023-01-27 ENCOUNTER — Ambulatory Visit: Payer: Medicare PPO | Admitting: Nurse Practitioner

## 2023-01-30 DIAGNOSIS — S20361A Insect bite (nonvenomous) of right front wall of thorax, initial encounter: Secondary | ICD-10-CM | POA: Diagnosis not present

## 2023-01-30 DIAGNOSIS — I10 Essential (primary) hypertension: Secondary | ICD-10-CM | POA: Diagnosis not present

## 2023-01-30 DIAGNOSIS — W57XXXA Bitten or stung by nonvenomous insect and other nonvenomous arthropods, initial encounter: Secondary | ICD-10-CM | POA: Diagnosis not present

## 2023-01-30 DIAGNOSIS — E039 Hypothyroidism, unspecified: Secondary | ICD-10-CM | POA: Diagnosis not present

## 2023-02-02 DIAGNOSIS — E291 Testicular hypofunction: Secondary | ICD-10-CM | POA: Diagnosis not present

## 2023-02-15 DIAGNOSIS — K5900 Constipation, unspecified: Secondary | ICD-10-CM | POA: Diagnosis not present

## 2023-02-15 DIAGNOSIS — N529 Male erectile dysfunction, unspecified: Secondary | ICD-10-CM | POA: Diagnosis not present

## 2023-02-15 DIAGNOSIS — E1142 Type 2 diabetes mellitus with diabetic polyneuropathy: Secondary | ICD-10-CM | POA: Diagnosis not present

## 2023-02-15 DIAGNOSIS — G4733 Obstructive sleep apnea (adult) (pediatric): Secondary | ICD-10-CM | POA: Diagnosis not present

## 2023-02-15 DIAGNOSIS — I129 Hypertensive chronic kidney disease with stage 1 through stage 4 chronic kidney disease, or unspecified chronic kidney disease: Secondary | ICD-10-CM | POA: Diagnosis not present

## 2023-02-15 DIAGNOSIS — M1990 Unspecified osteoarthritis, unspecified site: Secondary | ICD-10-CM | POA: Diagnosis not present

## 2023-02-15 DIAGNOSIS — K219 Gastro-esophageal reflux disease without esophagitis: Secondary | ICD-10-CM | POA: Diagnosis not present

## 2023-02-15 DIAGNOSIS — E039 Hypothyroidism, unspecified: Secondary | ICD-10-CM | POA: Diagnosis not present

## 2023-02-21 DIAGNOSIS — M1711 Unilateral primary osteoarthritis, right knee: Secondary | ICD-10-CM | POA: Diagnosis not present

## 2023-02-27 ENCOUNTER — Ambulatory Visit: Payer: Medicare PPO | Admitting: Nurse Practitioner

## 2023-03-02 DIAGNOSIS — E291 Testicular hypofunction: Secondary | ICD-10-CM | POA: Diagnosis not present

## 2023-03-10 DIAGNOSIS — I10 Essential (primary) hypertension: Secondary | ICD-10-CM | POA: Diagnosis not present

## 2023-03-10 DIAGNOSIS — E291 Testicular hypofunction: Secondary | ICD-10-CM | POA: Diagnosis not present

## 2023-03-17 DIAGNOSIS — M25562 Pain in left knee: Secondary | ICD-10-CM | POA: Diagnosis not present

## 2023-03-17 DIAGNOSIS — I10 Essential (primary) hypertension: Secondary | ICD-10-CM | POA: Diagnosis not present

## 2023-03-17 DIAGNOSIS — E291 Testicular hypofunction: Secondary | ICD-10-CM | POA: Diagnosis not present

## 2023-03-17 DIAGNOSIS — E1140 Type 2 diabetes mellitus with diabetic neuropathy, unspecified: Secondary | ICD-10-CM | POA: Diagnosis not present

## 2023-03-17 DIAGNOSIS — E1165 Type 2 diabetes mellitus with hyperglycemia: Secondary | ICD-10-CM | POA: Diagnosis not present

## 2023-03-17 DIAGNOSIS — E039 Hypothyroidism, unspecified: Secondary | ICD-10-CM | POA: Diagnosis not present

## 2023-03-17 DIAGNOSIS — Z125 Encounter for screening for malignant neoplasm of prostate: Secondary | ICD-10-CM | POA: Diagnosis not present

## 2023-03-17 DIAGNOSIS — R0683 Snoring: Secondary | ICD-10-CM | POA: Diagnosis not present

## 2023-03-17 DIAGNOSIS — J309 Allergic rhinitis, unspecified: Secondary | ICD-10-CM | POA: Diagnosis not present

## 2023-03-22 DIAGNOSIS — H43393 Other vitreous opacities, bilateral: Secondary | ICD-10-CM | POA: Diagnosis not present

## 2023-03-28 ENCOUNTER — Institutional Professional Consult (permissible substitution): Payer: Medicare PPO | Admitting: Pulmonary Disease

## 2023-03-30 ENCOUNTER — Other Ambulatory Visit (HOSPITAL_COMMUNITY): Payer: Medicare PPO

## 2023-03-30 DIAGNOSIS — M1711 Unilateral primary osteoarthritis, right knee: Secondary | ICD-10-CM | POA: Diagnosis not present

## 2023-04-06 DIAGNOSIS — M1711 Unilateral primary osteoarthritis, right knee: Secondary | ICD-10-CM | POA: Diagnosis not present

## 2023-04-13 DIAGNOSIS — M1711 Unilateral primary osteoarthritis, right knee: Secondary | ICD-10-CM | POA: Diagnosis not present

## 2023-04-14 DIAGNOSIS — E291 Testicular hypofunction: Secondary | ICD-10-CM | POA: Diagnosis not present

## 2023-05-11 DIAGNOSIS — E291 Testicular hypofunction: Secondary | ICD-10-CM | POA: Diagnosis not present

## 2023-05-23 ENCOUNTER — Encounter: Payer: Self-pay | Admitting: Pulmonary Disease

## 2023-05-23 ENCOUNTER — Ambulatory Visit: Payer: Medicare PPO | Admitting: Pulmonary Disease

## 2023-05-23 VITALS — BP 124/69 | HR 67 | Ht 72.0 in | Wt 276.0 lb

## 2023-05-23 DIAGNOSIS — R0683 Snoring: Secondary | ICD-10-CM | POA: Diagnosis not present

## 2023-05-23 DIAGNOSIS — G4733 Obstructive sleep apnea (adult) (pediatric): Secondary | ICD-10-CM | POA: Diagnosis not present

## 2023-05-23 DIAGNOSIS — I10 Essential (primary) hypertension: Secondary | ICD-10-CM | POA: Diagnosis not present

## 2023-05-23 NOTE — Assessment & Plan Note (Signed)
Well-controlled. Leg edema may be due to amlodipine

## 2023-05-23 NOTE — Progress Notes (Signed)
Subjective:    Patient ID: Keith Solis, male    DOB: 1955/09/22, 67 y.o.   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  While he was on telemetry, nursing observed his oxygen saturation to drop in the 60s due to sleep apnea and outpatient sleep study was Recommended.  He was seen by cardiology, testing was negative.  He does not have a smart phone so WatchPAT could not be scheduled.  Hence he is referred to [place] Provide reports loud snoring for many years.  She has witnessed apneas.  She also reports restless sleep, he talks a lot during his sleep.  Bedtime is around 10 PM he sleeps in a recliner often, TV stays on.  He will sometimes wake up and get into bed.  He reports 3-4 nocturnal awakenings including nocturia and is out of bed by 7 AM feeling tired with dryness of mouth. He has gained 20 pounds over the last 2 years There is no history suggestive of cataplexy, sleep paralysis or parasomnias Epworth sleepiness score is 13 and he reports sleepiness while sitting and reading, watching TV, lying down to rest in the afternoon   Significant tests/ events reviewed  12/2022 CT coronary revealed coronary artery calcium score of 0   Past Medical History:  Diagnosis Date   Diabetes mellitus (HCC)    Gastritis    GERD (gastroesophageal reflux disease)    Hiatal hernia    Hypertension    Neuropathy    Obesity    Schatzki's ring     Past Surgical History:  Procedure Laterality Date   BALLOON DILATION N/A 07/19/2022   Procedure: BALLOON DILATION;  Surgeon: Lanelle Bal, DO;  [facility];  Service: Endoscopy;  Laterality: N/A;   BIOPSY  07/19/2022   Procedure: BIOPSY;  Surgeon: Lanelle Bal, DO;  [facility];  Service: Endoscopy;;   cholecystectomy N/A    COLONOSCOPY WITH PROPOFOL N/A  07/19/2022   Procedure: COLONOSCOPY WITH PROPOFOL;  Surgeon: Lanelle Bal, DO;  [facility];  Service: Endoscopy;  Laterality: N/A;   ESOPHAGOGASTRODUODENOSCOPY (EGD) WITH PROPOFOL N/A 07/19/2022   Procedure: ESOPHAGOGASTRODUODENOSCOPY (EGD) WITH PROPOFOL;  Surgeon: Lanelle Bal, DO;  [facility];  Service: Endoscopy;  Laterality: N/A;   INGUINAL HERNIA REPAIR     POLYPECTOMY  07/19/2022   Procedure: POLYPECTOMY;  Surgeon: Lanelle Bal, DO;  [facility];  Service: Endoscopy;;   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    No Known Allergies  Social History   Socioeconomic History   Marital status: Married    Spouse name: Not on file   Number of children: Not on file   Years of education: Not on file   Highest education level: Not on file  Occupational History   Not on file  Tobacco Use   Smoking status: Former    Types: Cigarettes    Passive exposure: Never   Smokeless tobacco: Never  Vaping Use   Vaping status: Never Used  Substance and Sexual Activity   Alcohol use: Not Currently   Drug use: Never   Sexual activity: Yes  Other Topics Concern   Not on file  Social History Narrative   Not on file   Social Determinants of Health   Financial Resource Strain: Low Risk  (01/10/2023)  Overall Financial Resource Strain (CARDIA)    Difficulty of Paying Living Expenses: Not hard at all  Food Insecurity: No Food Insecurity (01/10/2023)   Hunger Vital Sign    Worried About Running Out of Food in the Last Year: Never true    Ran Out of Food in the Last Year: Never true  Transportation Needs: No Transportation Needs (01/10/2023)   PRAPARE - Administrator, Civil Service (Medical): No    Lack of Transportation (Non-Medical): No  Physical Activity: Insufficiently Active (01/10/2023)   Exercise Vital Sign    Days of Exercise per Week: 4 days    Minutes of Exercise per Session: 10 min  Stress: No Stress Concern Present  (01/10/2023)   [employer] of Occupational Health - Occupational Stress Questionnaire    Feeling of Stress : Only a little  Social Connections: Socially Integrated (01/10/2023)   Social Connection and Isolation Panel [NHANES]    Frequency of Communication with Friends and Family: More than three times a week    Frequency of Social Gatherings with Friends and Family: More than three times a week    Attends Religious Services: More than 4 times per year    Active Member of [employer] or Organizations: No    Attends Engineer, structural: More than 4 times per year    Marital Status: Married  Catering manager Violence: Not At Risk (01/10/2023)   Humiliation, Afraid, Rape, and Kick questionnaire    Fear of Current or Ex-Partner: No    Emotionally Abused: No    Physically Abused: No    Sexually Abused: No    Family History  Problem Relation Age of Onset   Colon polyps Mother    Colon polyps Father    Colon polyps Sister    Colon polyps Sister    Colon cancer Neg Hx      Review of Systems Constitutional: negative for anorexia, fevers and sweats  Eyes: negative for irritation, redness and visual disturbance  Ears, nose, mouth, throat, and face: negative for earaches, epistaxis, nasal congestion and sore throat  Respiratory: negative for cough, dyspnea on exertion, sputum and wheezing  Cardiovascular: negative for chest pain, dyspnea, lower extremity edema, orthopnea, palpitations and syncope  Gastrointestinal: negative for abdominal pain, constipation, diarrhea, melena, nausea and vomiting  Genitourinary:negative for dysuria, frequency and hematuria  Hematologic/lymphatic: negative for bleeding, easy bruising and lymphadenopathy  Musculoskeletal:negative for arthralgias, muscle weakness and stiff joints  Neurological: negative for coordination problems, gait problems, headaches and weakness  Endocrine: negative for diabetic symptoms including polydipsia, polyuria and weight  loss     Objective:   Physical Exam  Gen. Pleasant, obese, in no distress, normal affect ENT - no pallor,icterus, no post nasal drip, class 2-3 airway Neck: No JVD, no thyromegaly, no carotid bruits Lungs: no use of accessory muscles, no dullness to percussion, decreased without rales or rhonchi  Cardiovascular: Rhythm regular, heart sounds  normal, no murmurs or gallops, 1+ peripheral edema Abdomen: soft and non-tender, no hepatosplenomegaly, BS normal. Musculoskeletal: No deformities, no cyanosis or clubbing Neuro:  alert, non focal, no tremors       Assessment & Plan:

## 2023-05-23 NOTE — Patient Instructions (Signed)
X Home sleep test

## 2023-05-23 NOTE — Assessment & Plan Note (Signed)
Given excessive daytime somnolence, narrow pharyngeal exam, witnessed apneas & loud snoring, obstructive sleep apnea is very likely & an overnight polysomnogram will be scheduled as a home study. The pathophysiology of obstructive sleep apnea , it's cardiovascular consequences & modes of treatment including CPAP were discused with the patient in detail & they evidenced understanding.  Pretest probability is high.  He would be willing to use a CPAP if needed

## 2023-06-07 DIAGNOSIS — E291 Testicular hypofunction: Secondary | ICD-10-CM | POA: Diagnosis not present

## 2023-06-09 ENCOUNTER — Ambulatory Visit: Payer: Medicare PPO | Admitting: Internal Medicine

## 2023-06-15 ENCOUNTER — Telehealth: Payer: Self-pay

## 2023-06-15 NOTE — Telephone Encounter (Signed)
Called pt to schedule f/u appt with Brooke Bonito

## 2023-06-23 DIAGNOSIS — E291 Testicular hypofunction: Secondary | ICD-10-CM | POA: Diagnosis not present

## 2023-06-28 ENCOUNTER — Ambulatory Visit: Payer: Medicare PPO | Admitting: Gastroenterology

## 2023-07-11 DIAGNOSIS — Z125 Encounter for screening for malignant neoplasm of prostate: Secondary | ICD-10-CM | POA: Diagnosis not present

## 2023-07-11 DIAGNOSIS — E039 Hypothyroidism, unspecified: Secondary | ICD-10-CM | POA: Diagnosis not present

## 2023-07-11 DIAGNOSIS — I10 Essential (primary) hypertension: Secondary | ICD-10-CM | POA: Diagnosis not present

## 2023-07-17 DIAGNOSIS — M25562 Pain in left knee: Secondary | ICD-10-CM | POA: Diagnosis not present

## 2023-07-17 DIAGNOSIS — E291 Testicular hypofunction: Secondary | ICD-10-CM | POA: Diagnosis not present

## 2023-07-17 DIAGNOSIS — J309 Allergic rhinitis, unspecified: Secondary | ICD-10-CM | POA: Diagnosis not present

## 2023-07-17 DIAGNOSIS — E039 Hypothyroidism, unspecified: Secondary | ICD-10-CM | POA: Diagnosis not present

## 2023-07-17 DIAGNOSIS — G629 Polyneuropathy, unspecified: Secondary | ICD-10-CM | POA: Diagnosis not present

## 2023-07-17 DIAGNOSIS — E1165 Type 2 diabetes mellitus with hyperglycemia: Secondary | ICD-10-CM | POA: Diagnosis not present

## 2023-07-17 DIAGNOSIS — F17201 Nicotine dependence, unspecified, in remission: Secondary | ICD-10-CM | POA: Diagnosis not present

## 2023-07-17 DIAGNOSIS — R0683 Snoring: Secondary | ICD-10-CM | POA: Diagnosis not present

## 2023-07-17 DIAGNOSIS — I10 Essential (primary) hypertension: Secondary | ICD-10-CM | POA: Diagnosis not present

## 2023-08-03 DIAGNOSIS — R891 Abnormal level of hormones in specimens from other organs, systems and tissues: Secondary | ICD-10-CM | POA: Diagnosis not present

## 2023-08-03 DIAGNOSIS — R7989 Other specified abnormal findings of blood chemistry: Secondary | ICD-10-CM | POA: Diagnosis not present

## 2023-08-24 ENCOUNTER — Other Ambulatory Visit: Payer: Self-pay

## 2023-08-24 MED ORDER — OLMESARTAN MEDOXOMIL 40 MG PO TABS: 40.0000 mg | ORAL_TABLET | Freq: Every day | ORAL | 3 refills | Status: DC

## 2023-08-31 DIAGNOSIS — E291 Testicular hypofunction: Secondary | ICD-10-CM | POA: Diagnosis not present

## 2023-09-12 DIAGNOSIS — M1711 Unilateral primary osteoarthritis, right knee: Secondary | ICD-10-CM | POA: Diagnosis not present

## 2023-09-14 DIAGNOSIS — E291 Testicular hypofunction: Secondary | ICD-10-CM | POA: Diagnosis not present

## 2023-09-15 NOTE — Progress Notes (Unsigned)
GI Office Note    Referring Provider: Benita Stabile, MD Primary Care Physician:  Benita Stabile, MD Primary Gastroenterologist: Hennie Duos. Marletta Lor, DO  Date:  09/18/2023  ID:  Keith Solis, DOB 25-Dec-1955, MRN [id number]   Chief Complaint   Chief Complaint  Patient presents with   Constipation    Patient here today due to constipation, bloating, and he says he feels a knot near his umbilical area. Symptoms ongoing for a month. He has been taking benefiber daily, Metamucil daily,Linzess samples he has had from the past. He has even used the fleet suppositories, and has not had much success with either of these. Had Egd and Tcs last done by Dr. Marletta Lor 07/19/2022.    History of Present Illness  MARNELL PARRILL is a 67 y.o. male with a history of HTN with prior orthostatic hypotension, and schatzki ring s/p dilation presenting today with complaint of constipation.   Colonoscopy 11/07/2017 with [facility]: Hemorrhoids on perianal exam Two 3-4 mm polyps in the ascending colon and cecum. Single 8 mm polyp in the descending colon   Pathology results: Cecal polyp -serrated, ascending colon polyp-hyperplastic, descending colon polyp- tubular adenoma   OV 05/30/22. Dysphagia usually with cornbread, shredded wheat, pork skins.  Denies any issues with meats, liquids, or pills.  Denies frequent coughing.  Has occasional regurgitation of food.  Occasional belching and heartburn for which he takes Pepto or Rolaids.  Mostly symptoms during the day.  Denies eating spicy foods but does admit to fried foods giving him heartburn.  Occasional gas/bloating.  Had recent knee replacement.  Denied any lack of appetite, early satiety, unintentional weight loss, nausea/vomiting, constipation, diarrhea.  Scheduled for EGD with dilation and colonoscopy.  Advised famotidine as needed.   EGD 07/19/22: -3 cm hiatal hernia -moderate schatzki ring s/p dilation -gastritis s/p biopsy (negative H.  Pylori) -normal duodenum -esophageal biopsies taken (benign) -Patient advised PPI twice daily   Colonoscopy 07/19/22: -non bleeding internal hemorrhoids -4mm polyp in cecum (sessile serrated) -Repeat colonoscopy in 5 years  Last office visit 10/20/22.GERD not well controlled. Reported intermittent hard stools, rarely skips a day with bowel movements. Requested change to nexium but given possible insurance denial plan to try different agent. Dysphagia improved. Stopped pantoprazole and started on omeprazole 40 mg BID. Pepcid as needed. GERD diet. Start benefiber daily and stool softener as needed. F/u 3-4 months.   Today: Patient reports he has tried Benefiber, Metamucil, and some old Linzess samples as well as he has used a Fleet suppository without much success. Denis nausea or vomiting. No melena or brbpr.   For the last month he has been needing to make himself go to the bathroom. Most of the time he will go daily and sometimes multiple times a day. He is not feeling like he is emptying all the way and he has been feeling bloated as well. No changes with diet. He states during the summer he got kicked with a board in his lower abdomen but now that is not sore.   Was taking benefiber and then switched to metamucil.   When he coughs he feels like there is a knot above the umbilicus. Has done one fleet suppository and did that last night. Took some old Linzess but still didn't feel like he was empty all the way. Thinks that was the lowest dose. This past weekend he did not go Friday or Saturday and he took prune juice Saturday night and then Sunday night  took the fleet suppository.    Wt Readings from Last 3 Encounters:  09/18/23 277 lb (125.6 kg)  05/23/23 276 lb (125.2 kg)  01/24/23 275 lb (124.7 kg)    Current Outpatient Medications  Medication Sig Dispense Refill   albuterol (VENTOLIN HFA) 108 (90 Base) MCG/ACT inhaler Inhale 2 puffs into the lungs every 6 (six) hours as needed for  wheezing or shortness of breath.     amLODipine (NORVASC) 5 MG tablet Take 1 tablet (5 mg total) by mouth daily. 90 tablet 1   bisacodyl (DULCOLAX) 10 MG suppository Place 10 mg rectally as needed for moderate constipation.     gabapentin (NEURONTIN) 300 MG capsule Take 300 mg by mouth 3 (three) times daily.     levothyroxine (SYNTHROID) 75 MCG tablet Take 75 mcg by mouth daily before breakfast.     olmesartan (BENICAR) 40 MG tablet Take 1 tablet (40 mg total) by mouth daily. 90 tablet 3   omeprazole (PRILOSEC) 40 MG capsule Take 1 capsule by mouth twice daily 180 capsule 2   psyllium (METAMUCIL) 58.6 % packet Take 1 packet by mouth daily.     testosterone cypionate (DEPOTESTOSTERONE CYPIONATE) 200 MG/ML injection Inject 200 mg into the muscle every 14 (fourteen) days.     Wheat Dextrin (BENEFIBER DRINK MIX PO) Take by mouth daily.     aspirin EC 81 MG tablet Take 81 mg by mouth daily. Swallow whole. (Patient not taking: Reported on 05/23/2023)     No current facility-administered medications for this visit.    Past Medical History:  Diagnosis Date   Diabetes mellitus (HCC)    Gastritis    GERD (gastroesophageal reflux disease)    Hiatal hernia    Hypertension    Neuropathy    Obesity    Schatzki's ring     Past Surgical History:  Procedure Laterality Date   BALLOON DILATION N/A 07/19/2022   Procedure: BALLOON DILATION;  Surgeon: Lanelle Bal, DO;  [facility];  Service: Endoscopy;  Laterality: N/A;   BIOPSY  07/19/2022   Procedure: BIOPSY;  Surgeon: Lanelle Bal, DO;  [facility];  Service: Endoscopy;;   cholecystectomy N/A    COLONOSCOPY WITH PROPOFOL N/A 07/19/2022   Procedure: COLONOSCOPY WITH PROPOFOL;  Surgeon: Lanelle Bal, DO;  [facility];  Service: Endoscopy;  Laterality: N/A;   ESOPHAGOGASTRODUODENOSCOPY (EGD) WITH PROPOFOL N/A 07/19/2022   Procedure: ESOPHAGOGASTRODUODENOSCOPY (EGD) WITH PROPOFOL;  Surgeon: Lanelle Bal, DO;  [facility];  Service: Endoscopy;  Laterality: N/A;   INGUINAL HERNIA REPAIR     POLYPECTOMY  07/19/2022   Procedure: POLYPECTOMY;  Surgeon: Lanelle Bal, DO;  [facility];  Service: Endoscopy;;   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    Family History  Problem Relation Age of Onset   Colon polyps Mother    Colon polyps Father    Colon polyps Sister    Colon polyps Sister    Colon cancer Neg Hx     Allergies as of 09/18/2023   (No Known Allergies)    Social History   Socioeconomic History   Marital status: Married    Spouse name: Not on file   Number of children: Not on file   Years of education: Not on file   Highest education level: Not on file  Occupational History   Not on file  Tobacco Use   Smoking status: Former    Types: Cigarettes  Passive exposure: Never   Smokeless tobacco: Never  Vaping Use   Vaping status: Never Used  Substance and Sexual Activity   Alcohol use: Not Currently   Drug use: Never   Sexual activity: Yes  Other Topics Concern   Not on file  Social History Narrative   Not on file   Social Drivers of Health   Financial Resource Strain: Low Risk  (01/10/2023)   Overall Financial Resource Strain (CARDIA)    Difficulty of Paying Living Expenses: Not hard at all  Food Insecurity: No Food Insecurity (01/10/2023)   Hunger Vital Sign    Worried About Running Out of Food in the Last Year: Never true    Ran Out of Food in the Last Year: Never true  Transportation Needs: No Transportation Needs (01/10/2023)   PRAPARE - Administrator, Civil Service (Medical): No    Lack of Transportation (Non-Medical): No  Physical Activity: Insufficiently Active (01/10/2023)   Exercise Vital Sign    Days of Exercise per Week: 4 days    Minutes of Exercise per Session: 10 min  Stress: No Stress Concern Present (01/10/2023)   [employer] of Occupational Health - Occupational Stress Questionnaire     Feeling of Stress : Only a little  Social Connections: Socially Integrated (01/10/2023)   Social Connection and Isolation Panel [NHANES]    Frequency of Communication with Friends and Family: More than three times a week    Frequency of Social Gatherings with Friends and Family: More than three times a week    Attends Religious Services: More than 4 times per year    Active Member of [employer] or Organizations: No    Attends Engineer, structural: More than 4 times per year    Marital Status: Married     Review of Systems   Gen: Denies fever, chills, anorexia. Denies fatigue, weakness, weight loss.  CV: Denies chest pain, palpitations, syncope, peripheral edema, and claudication. Resp: Denies dyspnea at rest, cough, wheezing, coughing up blood, and pleurisy. GI: See HPI Derm: Denies rash, itching, dry skin Psych: Denies depression, anxiety, memory loss, confusion. No homicidal or suicidal ideation.  Heme: Denies bruising, bleeding, and enlarged lymph nodes.  Physical Exam   BP 117/76 (BP Location: Left Arm, Patient Position: Sitting, Cuff Size: Large)   Pulse 73   Temp (!) 97.3 F (36.3 C) (Temporal)   Ht 6' (1.829 m)   Wt 277 lb (125.6 kg)   BMI 37.57 kg/m   General:   Alert and oriented. No distress noted. Pleasant and cooperative.  Head:  Normocephalic and atraumatic. Eyes:  Conjuctiva clear without scleral icterus. Mouth:  Oral mucosa pink and moist. Good dentition. No lesions. Abdomen:  +BS, soft, non-tender, rounded.  Small umbilical hernia noted.  No rebound or guarding. No HSM or masses noted. Rectal: deferred Msk:  Symmetrical without gross deformities. Normal posture. Extremities:  Without edema. Neurologic:  Alert and  oriented x4 Psych:  Alert and cooperative. Normal mood and affect.  Assessment  OLANDA CARUFEL is a 67 y.o. male with a history of GERD, diabetes, HTN with prior orthostatic hypotension, and schatzki ring s/p dilation presenting today with  complain of constipation.   GERD: Doing fairly well on omeprazole 40 mg twice daily.  Denies any dysphagia, nausea, vomiting.  Constipation: Having more infrequent bowel movements.  Usually having a bowel movement daily but also now recently experiencing incomplete emptying as well as some bloating.  Does admit to not drinking  as much water as he should.  Has tried fiber supplementation daily for the last 3-4 weeks.  Tried a suppository last night.  Has not tried any MiraLAX over-the-counter.  Tried some low-dose Linzess also without any significant relief, was able to go however still felt incomplete emptying.  Denies any melena, BRBPR, lack of appetite, abdominal pain.  Will order abdominal x-ray to assess for degree of constipation.  We discussed multiple over-the-counter options, completing a mini bowel prep, versus trying a higher dose of Linzess.  Patient stated he would like to try Linzess first.  PLAN   KUB Linzess 290 mcg samples today.  Advised to call with progress report prior to finishing samples. Educated on washout period of Linzess.  Can reduce to daily if needed. Continue benefiber Increase water intake (4-6 glasses daily) Continue omeprazole 40 mg twice daily Follow up in 8 weeks   Brooke Bonito, MSN, FNP-BC, AGACNP-BC [facility]

## 2023-09-18 ENCOUNTER — Encounter: Payer: Self-pay | Admitting: Gastroenterology

## 2023-09-18 ENCOUNTER — Ambulatory Visit (HOSPITAL_COMMUNITY)
Admission: RE | Admit: 2023-09-18 | Discharge: 2023-09-18 | Disposition: A | Discharge status: Home / Self Care | Payer: Medicare PPO | Source: Ambulatory Visit | Attending: Gastroenterology | Admitting: Gastroenterology

## 2023-09-18 ENCOUNTER — Ambulatory Visit (INDEPENDENT_AMBULATORY_CARE_PROVIDER_SITE_OTHER): Payer: Medicare PPO | Admitting: Gastroenterology

## 2023-09-18 VITALS — BP 117/76 | HR 73 | Temp 97.3°F | Ht 72.0 in | Wt 277.0 lb

## 2023-09-18 DIAGNOSIS — R140 Abdominal distension (gaseous): Secondary | ICD-10-CM | POA: Diagnosis not present

## 2023-09-18 DIAGNOSIS — K219 Gastro-esophageal reflux disease without esophagitis: Secondary | ICD-10-CM

## 2023-09-18 DIAGNOSIS — K5900 Constipation, unspecified: Secondary | ICD-10-CM

## 2023-09-18 DIAGNOSIS — Z9049 Acquired absence of other specified parts of digestive tract: Secondary | ICD-10-CM | POA: Diagnosis not present

## 2023-09-18 NOTE — Patient Instructions (Signed)
I am providing with some samples of Linzess 290 mcg to take once daily.  Please call me prior to finishing her samples to let me know if this dose is too high or if you would like to continue and send in a prescription.  How to take Linzess: Once a day every day on empty stomach, at least 30 minutes before your first meal of the day. It is best to keep medications at a stable temperature Medication is best kept in its original bottle with the disket present.  It is a medication that is meant for everyday use and not to be used as needed.   What to expect: Constipation relief is typically felt in about 1 week Relief of abdominal pain, discomfort, and bloating begins in about 1 week with symptoms typically improving over 12 weeks and beyond. Diarrhea is most common side effect and typically begins within the first 2 weeks and can take 3-4 weeks to resolve It would be helpful to begin treatment over the weekend or when you can be closer to a bathroom   You can go to [email or web address] for patient support and sign up for daily medication reminders.   I have ordered an abdominal x-ray for you.  This can be done anytime at [facility], you do not need an appointment.  I want you to continue your Benefiber 1 tablespoon daily with 8 ounces of water.  Your goal water intake is 4-6 8 ounce glasses of water daily or 2-3 bottles of water daily.  Continue omeprazole 40 mg twice daily.  Follow-up in 8 weeks.  I hope you have a happy new year!  It was a pleasure to see you today. I want to create trusting relationships with patients. If you receive a survey regarding your visit,  I greatly appreciate you taking time to fill this out on paper or through your MyChart. I value your feedback.  Brooke Bonito, MSN, FNP-BC, AGACNP-BC [facility]

## 2023-10-02 ENCOUNTER — Ambulatory Visit: Payer: Medicare PPO | Admitting: Internal Medicine

## 2023-10-02 ENCOUNTER — Encounter: Payer: Self-pay | Admitting: Internal Medicine

## 2023-10-02 VITALS — BP 136/71 | HR 64 | Ht 72.0 in | Wt 277.4 lb

## 2023-10-02 DIAGNOSIS — I10 Essential (primary) hypertension: Secondary | ICD-10-CM | POA: Diagnosis not present

## 2023-10-02 DIAGNOSIS — E039 Hypothyroidism, unspecified: Secondary | ICD-10-CM

## 2023-10-02 DIAGNOSIS — Z79899 Other long term (current) drug therapy: Secondary | ICD-10-CM | POA: Diagnosis not present

## 2023-10-02 DIAGNOSIS — Z1159 Encounter for screening for other viral diseases: Secondary | ICD-10-CM

## 2023-10-02 DIAGNOSIS — G4733 Obstructive sleep apnea (adult) (pediatric): Secondary | ICD-10-CM

## 2023-10-02 DIAGNOSIS — K219 Gastro-esophageal reflux disease without esophagitis: Secondary | ICD-10-CM | POA: Diagnosis not present

## 2023-10-02 DIAGNOSIS — E291 Testicular hypofunction: Secondary | ICD-10-CM

## 2023-10-02 DIAGNOSIS — G629 Polyneuropathy, unspecified: Secondary | ICD-10-CM | POA: Diagnosis not present

## 2023-10-02 DIAGNOSIS — E1165 Type 2 diabetes mellitus with hyperglycemia: Secondary | ICD-10-CM | POA: Diagnosis not present

## 2023-10-02 DIAGNOSIS — E669 Obesity, unspecified: Secondary | ICD-10-CM | POA: Diagnosis not present

## 2023-10-02 MED ORDER — GABAPENTIN 300 MG PO CAPS: 300.0000 mg | ORAL_CAPSULE | Freq: Three times a day (TID) | ORAL | 2 refills | Status: DC

## 2023-10-02 MED ORDER — TESTOSTERONE CYPIONATE 200 MG/ML IM SOLN
200.0000 mg | INTRAMUSCULAR | Status: DC
Start: 2023-10-02 — End: 2023-12-04
  Administered 2023-10-02: 200 mg via INTRAMUSCULAR

## 2023-10-02 NOTE — Assessment & Plan Note (Signed)
 Currently prescribed amlodipine 5 mg daily and olmesartan 40 mg daily.  BP is adequately controlled on this regimen.  No medication changes are indicated today.

## 2023-10-02 NOTE — Progress Notes (Signed)
 New Patient Office Visit  Subjective    Patient ID: Keith Solis, male    DOB: [birth year]  Age: 68 y.o. MRN: [id number]  CC:  Chief Complaint  Patient presents with   Establish Care   HPI Keith Solis presents to establish care.  He is a 68 year old male who endorses a past medical history significant for HTN, hypothyroidism, peripheral neuropathy, GERD, hypogonadism, T2DM, and former tobacco use.  Previously followed by Zack Hall, MD.  Mr. Goodley reports feeling well today.  He is asymptomatic and has no acute concerns to discuss aside from desiring to establish care.  He is retired and endorses former tobacco use, quitting 12-13 years ago, but no accumulated at least a 20-pack-year smoking history.  Denies alcohol and illicit drug use.  Family medical history significant for CAD, multiple cancers, sarcoidosis, and diabetes mellitus.  Chronic medical conditions and outstanding preventative care items discussed today are individually addressed in A/P below.  Outpatient Encounter Medications as of 10/02/2023  Medication Sig   albuterol (VENTOLIN HFA) 108 (90 Base) MCG/ACT inhaler Inhale 2 puffs into the lungs every 6 (six) hours as needed for wheezing or shortness of breath.   amLODipine  (NORVASC ) 5 MG tablet Take 1 tablet (5 mg total) by mouth daily.   levothyroxine  (SYNTHROID ) 75 MCG tablet Take 75 mcg by mouth daily before breakfast.   olmesartan  (BENICAR ) 40 MG tablet Take 1 tablet (40 mg total) by mouth daily.   omeprazole  (PRILOSEC) 40 MG capsule Take 1 capsule by mouth twice daily   psyllium (METAMUCIL) 58.6 % packet Take 1 packet by mouth daily.   testosterone  cypionate (DEPOTESTOSTERONE CYPIONATE) 200 MG/ML injection Inject 200 mg into the muscle every 14 (fourteen) days.   Wheat Dextrin (BENEFIBER DRINK MIX PO) Take by mouth daily.   [DISCONTINUED] gabapentin  (NEURONTIN ) 300 MG capsule Take 300 mg by mouth 3 (three) times daily.   aspirin  EC 81 MG tablet Take 81 mg by mouth  daily. Swallow whole. (Patient not taking: Reported on 05/23/2023)   bisacodyl (DULCOLAX) 10 MG suppository Place 10 mg rectally as needed for moderate constipation. (Patient not taking: Reported on 10/02/2023)   gabapentin  (NEURONTIN ) 300 MG capsule Take 1 capsule (300 mg total) by mouth 3 (three) times daily.   No facility-administered encounter medications on file as of 10/02/2023.    Past Medical History:  Diagnosis Date   Diabetes mellitus (HCC)    Gastritis    GERD (gastroesophageal reflux disease)    Hiatal hernia    Hypertension    Neuropathy    Obesity    Schatzki's ring     Past Surgical History:  Procedure Laterality Date   BALLOON DILATION N/A 07/19/2022   Procedure: BALLOON DILATION;  Surgeon: Cindie Carlin POUR, DO;  [facility];  Service: Endoscopy;  Laterality: N/A;   BIOPSY  07/19/2022   Procedure: BIOPSY;  Surgeon: Cindie Carlin POUR, DO;  [facility];  Service: Endoscopy;;   cholecystectomy N/A    COLONOSCOPY WITH PROPOFOL  N/A 07/19/2022   Procedure: COLONOSCOPY WITH PROPOFOL ;  Surgeon: Cindie Carlin POUR, DO;  [facility];  Service: Endoscopy;  Laterality: N/A;   ESOPHAGOGASTRODUODENOSCOPY (EGD) WITH PROPOFOL  N/A 07/19/2022   Procedure: ESOPHAGOGASTRODUODENOSCOPY (EGD) WITH PROPOFOL ;  Surgeon: Cindie Carlin POUR, DO;  [facility];  Service: Endoscopy;  Laterality: N/A;   INGUINAL HERNIA REPAIR     POLYPECTOMY  07/19/2022   Procedure: POLYPECTOMY;  Surgeon: Cindie Carlin POUR, DO;  [facility];  Service: Endoscopy;;   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    Family History  Problem Relation Age of Onset   Colon polyps Mother    Colon polyps Father    Colon polyps Sister    Colon polyps Sister    Colon cancer Neg Hx     Social History   Socioeconomic History   Marital status: Married    Spouse name: Not on file   Number of children: Not on file   Years of education: Not on file   Highest  education level: Not on file  Occupational History   Not on file  Tobacco Use   Smoking status: Former    Types: Cigarettes    Passive exposure: Never   Smokeless tobacco: Never  Vaping Use   Vaping status: Never Used  Substance and Sexual Activity   Alcohol use: Not Currently   Drug use: Never   Sexual activity: Yes  Other Topics Concern   Not on file  Social History Narrative   Not on file   Social Drivers of Health   Financial Resource Strain: Low Risk  (01/10/2023)   Overall Financial Resource Strain (CARDIA)    Difficulty of Paying Living Expenses: Not hard at all  Food Insecurity: No Food Insecurity (01/10/2023)   Hunger Vital Sign    Worried About Running Out of Food in the Last Year: Never true    Ran Out of Food in the Last Year: Never true  Transportation Needs: No Transportation Needs (01/10/2023)   PRAPARE - Administrator, Civil Service (Medical): No    Lack of Transportation (Non-Medical): No  Physical Activity: Insufficiently Active (01/10/2023)   Exercise Vital Sign    Days of Exercise per Week: 4 days    Minutes of Exercise per Session: 10 min  Stress: No Stress Concern Present (01/10/2023)   [employer] of Occupational Health - Occupational Stress Questionnaire    Feeling of Stress : Only a little  Social Connections: Socially Integrated (01/10/2023)   Social Connection and Isolation Panel [NHANES]    Frequency of Communication with Friends and Family: More than three times a week    Frequency of Social Gatherings with Friends and Family: More than three times a week    Attends Religious Services: More than 4 times per year    Active Member of [employer] or Organizations: No    Attends Engineer, Structural: More than 4 times per year    Marital Status: Married  Catering Manager Violence: Not At Risk (01/10/2023)   Humiliation, Afraid, Rape, and Kick questionnaire    Fear of Current or Ex-Partner: No    Emotionally Abused: No     Physically Abused: No    Sexually Abused: No    ROS      Objective    BP 136/71 (BP Location: Left Arm, Patient Position: Sitting, Cuff Size: Large)   Pulse 64   Ht 6' (1.829 m)   Wt 277 lb 6.4 oz (125.8 kg)   SpO2 92%   BMI 37.62 kg/m   Physical Exam      Assessment & Plan:   Problem List Items Addressed This Visit       Essential hypertension - Primary   Currently prescribed amlodipine  5 mg daily and olmesartan  40 mg daily.  BP is adequately controlled on this regimen.  No medication changes are indicated today.      OSA (obstructive sleep apnea)   Suspected diagnosis.  Previously evaluated by pulmonology.  HST ordered but not completed.  He is tentatively scheduled to follow-up with pulmonology in March 2025.  History of excessive daytime somnolence, narrow pharynx on exam, witnessed apneic events and loud snoring.      GERD without esophagitis   Followed by gastroenterology.  Symptoms are adequately controlled with omeprazole  40 mg twice daily.      Acquired hypothyroidism   Currently prescribed levothyroxine  75 mcg daily.  He is asymptomatic currently.  Repeat thyroid  studies ordered today.      Hyperglycemia due to type 2 diabetes mellitus (HCC)   Previously documented history of T2DM.  Diet controlled.  Repeat A1c and urine microalbumin/creatinine ratio ordered today.      Male hypogonadism   He is prescribed testosterone  replacement therapy at 200 mg every 14 days.  He is due for his next injection.  Repeat free and total testosterone  levels ordered today      Neuropathy   Symptoms are adequately controlled with gabapentin  300 mg 3 times daily.      Return in about 3 months (around 12/31/2023).   Manus FORBES Fireman, MD

## 2023-10-02 NOTE — Assessment & Plan Note (Signed)
 He is prescribed testosterone replacement therapy at 200 mg every 14 days.  He is due for his next injection.  Repeat free and total testosterone levels ordered today

## 2023-10-02 NOTE — Assessment & Plan Note (Signed)
 Symptoms are adequately controlled with gabapentin 300 mg 3 times daily.

## 2023-10-02 NOTE — Assessment & Plan Note (Signed)
 Currently prescribed levothyroxine 75 mcg daily.  He is asymptomatic currently.  Repeat thyroid studies ordered today.

## 2023-10-02 NOTE — Patient Instructions (Signed)
It was a pleasure to see you today.  Thank you for giving [place] the opportunity to be involved in your care.  Below is a brief recap of your visit and next steps.  We will plan to see you again in 3 months.  Summary You have established care today We will check basic labs Medications refilled Follow up in 3 months

## 2023-10-02 NOTE — Assessment & Plan Note (Signed)
 Previously documented history of T2DM.  Diet controlled.  Repeat A1c and urine microalbumin/creatinine ratio ordered today.

## 2023-10-02 NOTE — Assessment & Plan Note (Signed)
 Followed by gastroenterology.  Symptoms are adequately controlled with omeprazole 40 mg twice daily.

## 2023-10-02 NOTE — Assessment & Plan Note (Signed)
 Suspected diagnosis.  Previously evaluated by pulmonology.  HST ordered but not completed.  He is tentatively scheduled to follow-up with pulmonology in March 2025.  History of excessive daytime somnolence, narrow pharynx on exam, witnessed apneic events and loud snoring.

## 2023-10-03 DIAGNOSIS — I10 Essential (primary) hypertension: Secondary | ICD-10-CM | POA: Diagnosis not present

## 2023-10-03 DIAGNOSIS — E039 Hypothyroidism, unspecified: Secondary | ICD-10-CM | POA: Diagnosis not present

## 2023-10-03 DIAGNOSIS — E1165 Type 2 diabetes mellitus with hyperglycemia: Secondary | ICD-10-CM | POA: Diagnosis not present

## 2023-10-03 DIAGNOSIS — G629 Polyneuropathy, unspecified: Secondary | ICD-10-CM | POA: Diagnosis not present

## 2023-10-03 DIAGNOSIS — K219 Gastro-esophageal reflux disease without esophagitis: Secondary | ICD-10-CM | POA: Diagnosis not present

## 2023-10-03 DIAGNOSIS — E291 Testicular hypofunction: Secondary | ICD-10-CM | POA: Diagnosis not present

## 2023-10-03 DIAGNOSIS — E669 Obesity, unspecified: Secondary | ICD-10-CM | POA: Diagnosis not present

## 2023-10-03 DIAGNOSIS — Z1159 Encounter for screening for other viral diseases: Secondary | ICD-10-CM | POA: Diagnosis not present

## 2023-10-05 ENCOUNTER — Ambulatory Visit: Payer: Medicare PPO | Admitting: Internal Medicine

## 2023-10-06 LAB — B12 AND FOLATE PANEL
Folate: 9.1 ng/mL (ref >3.0)
Vitamin B-12: 471 pg/mL (ref 232–1245)

## 2023-10-06 LAB — CMP14+EGFR
ALT: 10 [IU]/L (ref 0–44)
AST: 12 [IU]/L (ref 0–40)
Albumin: 4.5 g/dL (ref 3.9–4.9)
Alkaline Phosphatase: 74 [IU]/L (ref 44–121)
BUN/Creatinine Ratio: 10 (ref 10–24)
BUN: 12 mg/dL (ref 8–27)
Bilirubin Total: 0.8 mg/dL (ref 0.0–1.2)
CO2: 27 mmol/L (ref 20–29)
Calcium: 9.3 mg/dL (ref 8.6–10.2)
Chloride: 102 mmol/L (ref 96–106)
Creatinine, Ser: 1.2 mg/dL (ref 0.76–1.27)
Globulin, Total: 2.3 g/dL (ref 1.5–4.5)
Glucose: 89 mg/dL (ref 70–99)
Potassium: 4.3 mmol/L (ref 3.5–5.2)
Sodium: 144 mmol/L (ref 134–144)
Total Protein: 6.8 g/dL (ref 6.0–8.5)
eGFR: 67 mL/min/{1.73_m2} (ref >59)

## 2023-10-06 LAB — CBC WITH DIFFERENTIAL/PLATELET
Basophils Absolute: 0.1 10*3/uL (ref 0.0–0.2)
Basos: 1 %
EOS (ABSOLUTE): 0.4 10*3/uL (ref 0.0–0.4)
Eos: 4 %
Hematocrit: 53.2 % — ABNORMAL HIGH (ref 37.5–51.0)
Hemoglobin: 17.5 g/dL (ref 13.0–17.7)
Immature Grans (Abs): 0.1 10*3/uL (ref 0.0–0.1)
Immature Granulocytes: 1 %
Lymphocytes Absolute: 2.6 10*3/uL (ref 0.7–3.1)
Lymphs: 28 %
MCH: 29.3 pg (ref 26.6–33.0)
MCHC: 32.9 g/dL (ref 31.5–35.7)
MCV: 89 fL (ref 79–97)
Monocytes Absolute: 0.7 10*3/uL (ref 0.1–0.9)
Monocytes: 7 %
Neutrophils Absolute: 5.4 10*3/uL (ref 1.4–7.0)
Neutrophils: 59 %
Platelets: 216 10*3/uL (ref 150–450)
RBC: 5.98 x10E6/uL — ABNORMAL HIGH (ref 4.14–5.80)
RDW: 12.6 % (ref 11.6–15.4)
WBC: 9.2 10*3/uL (ref 3.4–10.8)

## 2023-10-06 LAB — HEMOGLOBIN A1C
Est. average glucose Bld gHb Est-mCnc: 131 mg/dL
Hgb A1c MFr Bld: 6.2 % — ABNORMAL HIGH (ref 4.8–5.6)

## 2023-10-06 LAB — PSA: Prostate Specific Ag, Serum: 3.2 ng/mL (ref 0.0–4.0)

## 2023-10-06 LAB — LIPID PANEL
Chol/HDL Ratio: 2.2 {ratio} (ref 0.0–5.0)
Cholesterol, Total: 80 mg/dL — ABNORMAL LOW (ref 100–199)
HDL: 37 mg/dL — ABNORMAL LOW (ref >39)
LDL Chol Calc (NIH): 28 mg/dL (ref 0–99)
Triglycerides: 66 mg/dL (ref 0–149)
VLDL Cholesterol Cal: 15 mg/dL (ref 5–40)

## 2023-10-06 LAB — TOXASSURE SELECT 13 (MW), URINE

## 2023-10-06 LAB — VITAMIN D 25 HYDROXY (VIT D DEFICIENCY, FRACTURES): Vit D, 25-Hydroxy: 34.3 ng/mL (ref 30.0–100.0)

## 2023-10-06 LAB — TESTOSTERONE,FREE AND TOTAL
Testosterone, Free: 11.7 pg/mL (ref 6.6–18.1)
Testosterone: 616 ng/dL (ref 264–916)

## 2023-10-06 LAB — MICROALBUMIN / CREATININE URINE RATIO
Creatinine, Urine: 124.7 mg/dL
Microalb/Creat Ratio: 4 mg/g{creat} (ref 0–29)
Microalbumin, Urine: 4.4 ug/mL

## 2023-10-06 LAB — HCV AB W REFLEX TO QUANT PCR: HCV Ab: NONREACTIVE

## 2023-10-06 LAB — TSH+FREE T4
Free T4: 1.24 ng/dL (ref 0.82–1.77)
TSH: 4.64 u[IU]/mL — ABNORMAL HIGH (ref 0.450–4.500)

## 2023-10-06 LAB — HCV INTERPRETATION

## 2023-10-25 ENCOUNTER — Encounter: Payer: Self-pay | Admitting: Podiatry

## 2023-10-25 ENCOUNTER — Ambulatory Visit (INDEPENDENT_AMBULATORY_CARE_PROVIDER_SITE_OTHER): Payer: Medicare PPO

## 2023-10-25 ENCOUNTER — Ambulatory Visit: Payer: Medicare PPO | Admitting: Podiatry

## 2023-10-25 DIAGNOSIS — L603 Nail dystrophy: Secondary | ICD-10-CM | POA: Diagnosis not present

## 2023-10-25 DIAGNOSIS — S90121A Contusion of right lesser toe(s) without damage to nail, initial encounter: Secondary | ICD-10-CM

## 2023-10-25 DIAGNOSIS — M778 Other enthesopathies, not elsewhere classified: Secondary | ICD-10-CM

## 2023-10-25 NOTE — Progress Notes (Signed)
 Subjective:  Patient ID: Keith Solis, male    DOB: 1956/04/06,  MRN: [id number]  Chief Complaint  Patient presents with   Foot Pain    Rm#11 Right foot pinky toe injury this morning need nail removal.    68 y.o. male presents with the above complaint.  Patient presents with right fifth digit nail contusion with damage to the nail.  Patient states the nail is partially detached.  There he stubbed it against his toe side of the bed.  He wanted get it evaluated is a diabetic.  He denies seeing anyone else prior to seeing me for this.  Denies any other acute issues.   Review of Systems: Negative except as noted in the HPI. Denies N/V/F/Ch.  Past Medical History:  Diagnosis Date   Diabetes mellitus (HCC)    Gastritis    GERD (gastroesophageal reflux disease)    Hiatal hernia    Hypertension    Neuropathy    Obesity    Schatzki's ring     Current Outpatient Medications:    albuterol (VENTOLIN HFA) 108 (90 Base) MCG/ACT inhaler, Inhale 2 puffs into the lungs every 6 (six) hours as needed for wheezing or shortness of breath., Disp: , Rfl:    amLODipine  (NORVASC ) 5 MG tablet, Take 1 tablet (5 mg total) by mouth daily., Disp: 90 tablet, Rfl: 1   aspirin  EC 81 MG tablet, Take 81 mg by mouth daily. Swallow whole., Disp: , Rfl:    bisacodyl (DULCOLAX) 10 MG suppository, Place 10 mg rectally as needed for moderate constipation., Disp: , Rfl:    gabapentin  (NEURONTIN ) 300 MG capsule, Take 1 capsule (300 mg total) by mouth 3 (three) times daily., Disp: 90 capsule, Rfl: 2   levothyroxine  (SYNTHROID ) 75 MCG tablet, Take 75 mcg by mouth daily before breakfast., Disp: , Rfl:    olmesartan  (BENICAR ) 40 MG tablet, Take 1 tablet (40 mg total) by mouth daily., Disp: 90 tablet, Rfl: 3   omeprazole  (PRILOSEC) 40 MG capsule, Take 1 capsule by mouth twice daily, Disp: 180 capsule, Rfl: 2   psyllium (METAMUCIL) 58.6 % packet, Take 1 packet by mouth daily., Disp: , Rfl:    testosterone  cypionate  (DEPOTESTOSTERONE CYPIONATE) 200 MG/ML injection, Inject 200 mg into the muscle every 14 (fourteen) days., Disp: , Rfl:    Wheat Dextrin (BENEFIBER DRINK MIX PO), Take by mouth daily., Disp: , Rfl:   Current Facility-Administered Medications:    testosterone  cypionate (DEPOTESTOSTERONE CYPIONATE) injection 200 mg, 200 mg, Intramuscular, Q14 Days, Melvenia Manus BRAVO, MD, 200 mg at 10/02/23 1420  Social History   Tobacco Use  Smoking Status Former   Types: Cigarettes   Passive exposure: Never  Smokeless Tobacco Never    No Known Allergies Objective:  There were no vitals filed for this visit. There is no height or weight on file to calculate BMI. Constitutional Well developed. Well nourished.  Vascular Dorsalis pedis pulses palpable bilaterally. Posterior tibial pulses palpable bilaterally. Capillary refill normal to all digits.  No cyanosis or clubbing noted. Pedal hair growth normal.  Neurologic Normal speech. Oriented to person, place, and time. Epicritic sensation to light touch grossly present bilaterally.  Dermatologic Pain on palpation of the entire/total nail on 1st digit of the right.  50% partial avulsion noted.  No underlying wounds noted. No other open wounds. No skin lesions.  Orthopedic: Normal joint ROM without pain or crepitus bilaterally. No visible deformities. No bony tenderness.   Radiographs: 3 views of skeletally mature the right  foot: Synostosis noted of the fifth digit.  No acute fracture noted.  Patient has a history of previous fracture and chronic injury.  Midfoot arthritis noted no other abnormalities identified Assessment:   1. Contusion of fifth toe of right foot, initial encounter   2. Nail dystrophy    Plan:  Patient was evaluated and treated and all questions answered.  Nail contusion/dystrophy fifth, right -Patient elects to proceed with minor surgery to remove entire toenail today. Consent reviewed and signed by patient. -Entire/total nail  excised. See procedure note. -Educated on post-procedure care including soaking. Written instructions provided and reviewed. -Patient to follow up in 2 weeks for nail check.  Procedure: Excision of entire/total nail with phenol matricectomy Location: Right 5th toe digit Anesthesia: Lidocaine  1% plain; 1.5 mL and Marcaine 0.5% plain; 1.5 mL, digital block. Skin Prep: Betadine. Dressing: Silvadene; telfa; dry, sterile, compression dressing. Technique: Following skin prep, the toe was exsanguinated and a tourniquet was secured at the base of the toe. The affected nail border was freed and excised.  Phenol matricectomy was performed in standard technique the tourniquet was then removed and sterile dressing applied. Disposition: Patient tolerated procedure well. Patient to return in 2 weeks for follow-up.   No follow-ups on file.

## 2023-11-08 NOTE — Progress Notes (Deleted)
 GI Office Note    Referring Provider: Billie Lade, MD Primary Care Physician:  Billie Lade, MD Primary Gastroenterologist: Hennie Duos. Marletta Lor, DO  Date:  11/08/2023  ID:  Keith Solis, DOB [birth year], MRN [id number]   Chief Complaint   No chief complaint on file.  History of Present Illness  Keith Solis is a 68 y.o. male with a history of *** presenting today with complaint of   Colonoscopy 11/07/2017 with [facility]: Hemorrhoids on perianal exam Two 3-4 mm polyps in the ascending colon and cecum. Single 8 mm polyp in the descending colon   Pathology results: Cecal polyp -serrated, ascending colon polyp-hyperplastic, descending colon polyp- tubular adenoma   OV 05/30/22. Dysphagia usually with cornbread, shredded wheat, pork skins.  Denies any issues with meats, liquids, or pills.  Denies frequent coughing.  Has occasional regurgitation of food.  Occasional belching and heartburn for which he takes Pepto or Rolaids.  Mostly symptoms during the day.  Denies eating spicy foods but does admit to fried foods giving him heartburn.  Occasional gas/bloating.  Had recent knee replacement.  Denied any lack of appetite, early satiety, unintentional weight loss, nausea/vomiting, constipation, diarrhea.  Scheduled for EGD with dilation and colonoscopy.  Advised famotidine as needed.   EGD 07/19/22: -3 cm hiatal hernia -moderate schatzki ring s/p dilation -gastritis s/p biopsy (negative H. Pylori) -normal duodenum -esophageal biopsies taken (benign) -Patient advised PPI twice daily   Colonoscopy 07/19/22: -non bleeding internal hemorrhoids -4mm polyp in cecum (sessile serrated) -Repeat colonoscopy in 5 years   OV  10/20/22.GERD not well controlled. Reported intermittent hard stools, rarely skips a day with bowel movements. Requested change to nexium but given possible insurance denial plan to try different agent. Dysphagia improved. Stopped pantoprazole and started  on omeprazole 40 mg BID. Pepcid as needed. GERD diet. Start benefiber daily and stool softener as needed. F/u 3-4 months.    Last office visit 09/18/23.***.    Today:    Wt Readings from Last 3 Encounters:  10/02/23 277 lb 6.4 oz (125.8 kg)  09/18/23 277 lb (125.6 kg)  05/23/23 276 lb (125.2 kg)    Current Outpatient Medications  Medication Sig Dispense Refill   albuterol (VENTOLIN HFA) 108 (90 Base) MCG/ACT inhaler Inhale 2 puffs into the lungs every 6 (six) hours as needed for wheezing or shortness of breath.     amLODipine (NORVASC) 5 MG tablet Take 1 tablet (5 mg total) by mouth daily. 90 tablet 1   aspirin EC 81 MG tablet Take 81 mg by mouth daily. Swallow whole.     bisacodyl (DULCOLAX) 10 MG suppository Place 10 mg rectally as needed for moderate constipation.     gabapentin (NEURONTIN) 300 MG capsule Take 1 capsule (300 mg total) by mouth 3 (three) times daily. 90 capsule 2   levothyroxine (SYNTHROID) 75 MCG tablet Take 75 mcg by mouth daily before breakfast.     olmesartan (BENICAR) 40 MG tablet Take 1 tablet (40 mg total) by mouth daily. 90 tablet 3   omeprazole (PRILOSEC) 40 MG capsule Take 1 capsule by mouth twice daily 180 capsule 2   psyllium (METAMUCIL) 58.6 % packet Take 1 packet by mouth daily.     testosterone cypionate (DEPOTESTOSTERONE CYPIONATE) 200 MG/ML injection Inject 200 mg into the muscle every 14 (fourteen) days.     Wheat Dextrin (BENEFIBER DRINK MIX PO) Take by mouth daily.     Current Facility-Administered Medications  Medication Dose Route Frequency Provider  Last Rate Last Admin   testosterone cypionate (DEPOTESTOSTERONE CYPIONATE) injection 200 mg  200 mg Intramuscular Q14 Days Billie Lade, MD   200 mg at 10/02/23 1420    Past Medical History:  Diagnosis Date   Diabetes mellitus (HCC)    Gastritis    GERD (gastroesophageal reflux disease)    Hiatal hernia    Hypertension    Neuropathy    Obesity    Schatzki's ring     Past Surgical  History:  Procedure Laterality Date   BALLOON DILATION N/A 07/19/2022   Procedure: BALLOON DILATION;  Surgeon: Lanelle Bal, DO;  [facility];  Service: Endoscopy;  Laterality: N/A;   BIOPSY  07/19/2022   Procedure: BIOPSY;  Surgeon: Lanelle Bal, DO;  [facility]: [facility];  Service: Endoscopy;;   cholecystectomy N/A    COLONOSCOPY WITH PROPOFOL N/A 07/19/2022   Procedure: COLONOSCOPY WITH PROPOFOL;  Surgeon: Lanelle Bal, DO;  [facility]: [facility];  Service: Endoscopy;  Laterality: N/A;   ESOPHAGOGASTRODUODENOSCOPY (EGD) WITH PROPOFOL N/A 07/19/2022   Procedure: ESOPHAGOGASTRODUODENOSCOPY (EGD) WITH PROPOFOL;  Surgeon: Lanelle Bal, DO;  Location: [facility];  Service: Endoscopy;  Laterality: N/A;   INGUINAL HERNIA REPAIR     POLYPECTOMY  07/19/2022   Procedure: POLYPECTOMY;  Surgeon: Lanelle Bal, DO;  Location: [facility];  Service: Endoscopy;;   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    Family History  Problem Relation Age of Onset   Colon polyps Mother    Colon polyps Father    Colon polyps Sister    Colon polyps Sister    Colon cancer Neg Hx     Allergies as of 11/09/2023   (No Known Allergies)    Social History   Socioeconomic History   Marital status: Married    Spouse name: Not on file   Number of children: Not on file   Years of education: Not on file   Highest education level: Not on file  Occupational History   Not on file  Tobacco Use   Smoking status: Former    Types: Cigarettes    Passive exposure: Never   Smokeless tobacco: Never  Vaping Use   Vaping status: Never Used  Substance and Sexual Activity   Alcohol use: Not Currently   Drug use: Never   Sexual activity: Yes  Other Topics Concern   Not on file  Social History Narrative   Not on file   Social Drivers of Health   Financial Resource Strain: Low Risk  (01/10/2023)   Overall Financial Resource Strain (CARDIA)    Difficulty of Paying Living  Expenses: Not hard at all  Food Insecurity: No Food Insecurity (01/10/2023)   Hunger Vital Sign    Worried About Running Out of Food in the Last Year: Never true    Ran Out of Food in the Last Year: Never true  Transportation Needs: No Transportation Needs (01/10/2023)   PRAPARE - Administrator, Civil Service (Medical): No    Lack of Transportation (Non-Medical): No  Physical Activity: Insufficiently Active (01/10/2023)   Exercise Vital Sign    Days of Exercise per Week: 4 days    Minutes of Exercise per Session: 10 min  Stress: No Stress Concern Present (01/10/2023)   [employer] of Occupational Health - Occupational Stress Questionnaire    Feeling of Stress : Only a little  Social Connections: Socially Integrated (01/10/2023)   Social Connection and Isolation Panel [NHANES]  Frequency of Communication with Friends and Family: More than three times a week    Frequency of Social Gatherings with Friends and Family: More than three times a week    Attends Religious Services: More than 4 times per year    Active Member of [employer] or Organizations: No    Attends Engineer, structural: More than 4 times per year    Marital Status: Married     Review of Systems   Gen: Denies fever, chills, anorexia. Denies fatigue, weakness, weight loss.  CV: Denies chest pain, palpitations, syncope, peripheral edema, and claudication. Resp: Denies dyspnea at rest, cough, wheezing, coughing up blood, and pleurisy. GI: See HPI Derm: Denies rash, itching, dry skin Psych: Denies depression, anxiety, memory loss, confusion. No homicidal or suicidal ideation.  Heme: Denies bruising, bleeding, and enlarged lymph nodes.  Physical Exam   There were no vitals taken for this visit.  General:   Alert and oriented. No distress noted. Pleasant and cooperative.  Head:  Normocephalic and atraumatic. Eyes:  Conjuctiva clear without scleral icterus. Mouth:  Oral mucosa pink and moist. Good  dentition. No lesions. Lungs:  Clear to auscultation bilaterally. No wheezes, rales, or rhonchi. No distress.  Heart:  S1, S2 present without murmurs appreciated.  Abdomen:  +BS, soft, non-tender and non-distended. No rebound or guarding. No HSM or masses noted. Rectal: *** Msk:  Symmetrical without gross deformities. Normal posture. Extremities:  Without edema. Neurologic:  Alert and  oriented x4 Psych:  Alert and cooperative. Normal mood and affect.  Assessment  Keith Solis is a 68 y.o. male with a history of *** presenting today with    PLAN   ***     Brooke Bonito, MSN, FNP-BC, AGACNP-BC [facility]

## 2023-11-09 ENCOUNTER — Ambulatory Visit: Payer: Medicare PPO | Admitting: Gastroenterology

## 2023-11-13 ENCOUNTER — Ambulatory Visit: Payer: Medicare PPO | Admitting: Gastroenterology

## 2023-11-15 DIAGNOSIS — M1711 Unilateral primary osteoarthritis, right knee: Secondary | ICD-10-CM | POA: Diagnosis not present

## 2023-11-15 DIAGNOSIS — Z96652 Presence of left artificial knee joint: Secondary | ICD-10-CM | POA: Diagnosis not present

## 2023-11-21 ENCOUNTER — Ambulatory Visit: Payer: Medicare PPO | Attending: Internal Medicine | Admitting: Internal Medicine

## 2023-11-21 ENCOUNTER — Encounter: Payer: Self-pay | Admitting: Internal Medicine

## 2023-11-21 VITALS — BP 120/80 | HR 61 | Ht 72.0 in | Wt 279.0 lb

## 2023-11-21 DIAGNOSIS — R079 Chest pain, unspecified: Secondary | ICD-10-CM | POA: Diagnosis not present

## 2023-11-21 DIAGNOSIS — I10 Essential (primary) hypertension: Secondary | ICD-10-CM | POA: Diagnosis not present

## 2023-11-21 DIAGNOSIS — I493 Ventricular premature depolarization: Secondary | ICD-10-CM

## 2023-11-21 NOTE — Patient Instructions (Signed)
Medication Instructions:  Your physician recommends that you continue on your current medications as directed. Please refer to the Current Medication list given to you today.  *If you need a refill on your cardiac medications before your next appointment, please call your pharmacy*   Lab Work: NONE If you have labs (blood work) drawn today and your tests are completely normal, you will receive your results only by: MyChart Message (if you have MyChart) OR A paper copy in the mail If you have any lab test that is abnormal or we need to change your treatment, we will call you to review the results.   Testing/Procedures: NONE   Follow-Up: At [facility], you and your health needs are our priority.  As part of our continuing mission to provide you with exceptional heart care, we have created designated Provider Care Teams.  These Care Teams include your primary Cardiologist (physician) and Advanced Practice Providers (APPs -  Physician Assistants and Nurse Practitioners) who all work together to provide you with the care you need, when you need it.  We recommend signing up for the patient portal called "MyChart".  Sign up information is provided on this After Visit Summary.  MyChart is used to connect with patients for Virtual Visits (Telemedicine).  Patients are able to view lab/test results, encounter notes, upcoming appointments, etc.  Non-urgent messages can be sent to your provider as well.   To learn more about what you can do with MyChart, go to [email or web address].    Your next appointment:   2 year(s)  Provider:   Riley Lam, MD

## 2023-11-21 NOTE — Progress Notes (Signed)
 Cardiology Office Note:    Date:  11/21/2023   ID:  Keith Solis, DOB 1955-10-21, MRN [id number]  PCP:  Billie Lade, MD   [facility] Providers Cardiologist:  Christell Constant, MD     Referring MD: Benita Stabile, MD   CC: Follow up one year  History of Present Illness:    Keith Solis is a 68 y.o. male with a hx of HTN and prior orthostatic hypotension who presents for evaluation. 2024: OH resolved  Keith Solis is a 68 year old male who presents for a regular checkup.  He experienced chest pain and hypotension last year, leading to a hospital visit. A cardiac CT scan showed no blockages and a coronary calcium score of zero. He has a family history of heart disease on his father's side. No further episodes of syncope or chest pain have occurred, and his blood pressure is controlled.  He has a history of orthostatic hypotension and labile blood pressure, which resolved in 2024. His heart function was low normal in 2024, and he had a mild aortic dilation without regurgitation, which was not confirmed on a CT scan in April 2024.  He feels well and engages in physical activities such as building decks and fences. He has not experienced any more premature ventricular contractions. His cholesterol levels are better than expected, with an LDL of 20, and he is not on extensive medication.  Family history includes heart disease on his father's side and some cancer on his mother's side. No swelling, clear lungs, but notes darkening of the backs of his legs.  Past Medical History:  Diagnosis Date   Diabetes mellitus (HCC)    Gastritis    GERD (gastroesophageal reflux disease)    Hiatal hernia    Hypertension    Neuropathy    Obesity    Schatzki's ring     Past Surgical History:  Procedure Laterality Date   BALLOON DILATION N/A 07/19/2022   Procedure: BALLOON DILATION;  Surgeon: Lanelle Bal, DO;  [facility];  Service: Endoscopy;   Laterality: N/A;   BIOPSY  07/19/2022   Procedure: BIOPSY;  Surgeon: Lanelle Bal, DO;  [facility];  Service: Endoscopy;;   cholecystectomy N/A    COLONOSCOPY WITH PROPOFOL N/A 07/19/2022   Procedure: COLONOSCOPY WITH PROPOFOL;  Surgeon: Lanelle Bal, DO;  [facility];  Service: Endoscopy;  Laterality: N/A;   ESOPHAGOGASTRODUODENOSCOPY (EGD) WITH PROPOFOL N/A 07/19/2022   Procedure: ESOPHAGOGASTRODUODENOSCOPY (EGD) WITH PROPOFOL;  Surgeon: Lanelle Bal, DO;  [facility];  Service: Endoscopy;  Laterality: N/A;   INGUINAL HERNIA REPAIR     POLYPECTOMY  07/19/2022   Procedure: POLYPECTOMY;  Surgeon: Lanelle Bal, DO;  [facility];  Service: Endoscopy;;   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    Current Medications: Current Meds  Medication Sig   albuterol (VENTOLIN HFA) 108 (90 Base) MCG/ACT inhaler Inhale 2 puffs into the lungs every 6 (six) hours as needed for wheezing or shortness of breath.   amLODipine (NORVASC) 5 MG tablet Take 1 tablet (5 mg total) by mouth daily.   aspirin EC 81 MG tablet Take 81 mg by mouth daily. Swallow whole.   bisacodyl (DULCOLAX) 10 MG suppository Place 10 mg rectally as needed for moderate constipation.   gabapentin (NEURONTIN) 300 MG capsule Take 1 capsule (300 mg total) by mouth 3 (three) times daily.   levothyroxine (SYNTHROID) 75 MCG tablet  Take 75 mcg by mouth daily before breakfast.   olmesartan (BENICAR) 40 MG tablet Take 1 tablet (40 mg total) by mouth daily.   omeprazole (PRILOSEC) 40 MG capsule Take 1 capsule by mouth twice daily   psyllium (METAMUCIL) 58.6 % packet Take 1 packet by mouth daily.   Wheat Dextrin (BENEFIBER DRINK MIX PO) Take by mouth as needed (constipation).   Current Facility-Administered Medications for the 11/21/23 encounter (Office Visit) with Christell Constant, MD  Medication   testosterone cypionate (DEPOTESTOSTERONE CYPIONATE) injection 200 mg      Allergies:   Patient has no known allergies.   Social History   Socioeconomic History   Marital status: Married    Spouse name: Not on file   Number of children: Not on file   Years of education: Not on file   Highest education level: Not on file  Occupational History   Not on file  Tobacco Use   Smoking status: Former    Types: Cigarettes    Passive exposure: Never   Smokeless tobacco: Never  Vaping Use   Vaping status: Never Used  Substance and Sexual Activity   Alcohol use: Not Currently   Drug use: Never   Sexual activity: Yes  Other Topics Concern   Not on file  Social History Narrative   Not on file   Social Drivers of Health   Financial Resource Strain: Low Risk  (01/10/2023)   Overall Financial Resource Strain (CARDIA)    Difficulty of Paying Living Expenses: Not hard at all  Food Insecurity: No Food Insecurity (01/10/2023)   Hunger Vital Sign    Worried About Running Out of Food in the Last Year: Never true    Ran Out of Food in the Last Year: Never true  Transportation Needs: No Transportation Needs (01/10/2023)   PRAPARE - Administrator, Civil Service (Medical): No    Lack of Transportation (Non-Medical): No  Physical Activity: Insufficiently Active (01/10/2023)   Exercise Vital Sign    Days of Exercise per Week: 4 days    Minutes of Exercise per Session: 10 min  Stress: No Stress Concern Present (01/10/2023)   [employer] of Occupational Health - Occupational Stress Questionnaire    Feeling of Stress : Only a little  Social Connections: Socially Integrated (01/10/2023)   Social Connection and Isolation Panel [NHANES]    Frequency of Communication with Friends and Family: More than three times a week    Frequency of Social Gatherings with Friends and Family: More than three times a week    Attends Religious Services: More than 4 times per year    Active Member of [employer] or Organizations: No    Attends Engineer, structural: More  than 4 times per year    Marital Status: Married     Family History: History of coronary artery disease notable for no members. History of heart failure notable for no members. History of arrhythmia notable for no members. Denies family history of sudden cardiac death including drowning, car accidents, or unexplained deaths in the family no members No history of bicuspid aortic valve or aortic aneurysm or dissection.  (Patient's wife's father had TAA) No history of cardiomyopathies including hypertrophic cardiomyopathy, left ventricular non-compaction, or arrhythmogenic right ventricular cardiomyopathy.  ROS:   Please see the history of present illness.     EKGs/Labs/Other Studies Reviewed:    The following studies were reviewed today:  Cardiac Studies & Procedures   ______________________________________________________________________________________________  ECHOCARDIOGRAM  ECHOCARDIOGRAM COMPLETE 01/09/2023  Narrative ECHOCARDIOGRAM REPORT    Patient Name:   ARISTIDIS TALERICO Date of Exam: 01/09/2023 Medical Rec #:  [id number]       Height:       72.0 in Accession #:    [id number]      Weight:       277.8 lb Date of Birth:  December 28, 1955       BSA:          2.450 m Patient Age:    66 years        BP:           113/71 mmHg Patient Gender: M               HR:           71 bpm. [facility]  Procedure: 2D Echo, Cardiac Doppler and Color Doppler  Indications:    Chest Pain R07.9  History:        Patient has prior history of Echocardiogram examinations, most recent 04/15/2022. Arrythmias:PVC; Risk Factors:Hypertension. Hx of GERD.  Sonographer:    Celesta Gentile RCS Referring Phys: [phone] DAYNA N DUNN  IMPRESSIONS   1. Left ventricular ejection fraction, by estimation, is 50 to 55%. The left ventricle has low normal function. Left ventricular endocardial border not optimally defined to evaluate regional wall motion. There is mild left ventricular  hypertrophy. Left ventricular diastolic parameters are indeterminate. 2. Right ventricular systolic function is normal. The right ventricular size is normal. Tricuspid regurgitation signal is inadequate for assessing PA pressure. 3. Left atrial size was moderately dilated. 4. The mitral valve is grossly normal. No evidence of mitral valve regurgitation. No evidence of mitral stenosis. 5. The aortic valve is tricuspid. Aortic valve regurgitation is not visualized. No aortic stenosis is present. 6. Aortic dilatation noted. There is mild dilatation of the aortic root, measuring 40 mm. 7. The inferior vena cava is dilated in size with >50% respiratory variability, suggesting right atrial pressure of 8 mmHg.  Comparison(s): No significant change from prior study.  FINDINGS Left Ventricle: Left ventricular ejection fraction, by estimation, is 50 to 55%. The left ventricle has low normal function. Left ventricular endocardial border not optimally defined to evaluate regional wall motion. The left ventricular internal cavity size was normal in size. There is mild left ventricular hypertrophy. Left ventricular diastolic parameters are indeterminate.  Right Ventricle: The right ventricular size is normal. No increase in right ventricular wall thickness. Right ventricular systolic function is normal. Tricuspid regurgitation signal is inadequate for assessing PA pressure.  Left Atrium: Left atrial size was moderately dilated.  Right Atrium: Right atrial size was normal in size.  Pericardium: There is no evidence of pericardial effusion.  Mitral Valve: The mitral valve is grossly normal. No evidence of mitral valve regurgitation. No evidence of mitral valve stenosis.  Tricuspid Valve: The tricuspid valve is grossly normal. Tricuspid valve regurgitation is not demonstrated. No evidence of tricuspid stenosis.  Aortic Valve: The aortic valve is tricuspid. Aortic valve regurgitation is not visualized. No  aortic stenosis is present.  Pulmonic Valve: The pulmonic valve was not well visualized. Pulmonic valve regurgitation is not visualized. No evidence of pulmonic stenosis.  Aorta: Aortic dilatation noted. There is mild dilatation of the aortic root, measuring 40 mm.  Venous: The inferior vena cava is dilated in size with greater than 50% respiratory variability, suggesting right atrial pressure of 8 mmHg.  IAS/Shunts: No atrial level shunt detected  by color flow Doppler.   LEFT VENTRICLE PLAX 2D LVIDd:         6.10 cm   Diastology LVIDs:         4.20 cm   LV e' medial:    6.20 cm/s LV PW:         1.30 cm   LV E/e' medial:  12.1 LV IVS:        1.20 cm   LV e' lateral:   10.90 cm/s LVOT diam:     2.30 cm   LV E/e' lateral: 6.9 LV SV:         103 LV SV Index:   42 LVOT Area:     4.15 cm   RIGHT VENTRICLE RV S prime:     13.80 cm/s TAPSE (M-mode): 2.5 cm  LEFT ATRIUM              Index        RIGHT ATRIUM           Index LA diam:        4.40 cm  1.80 cm/m   RA Area:     22.50 cm LA Vol (A2C):   139.0 ml 56.74 ml/m  RA Volume:   72.70 ml  29.68 ml/m LA Vol (A4C):   83.1 ml  33.92 ml/m LA Biplane Vol: 113.0 ml 46.13 ml/m AORTIC VALVE LVOT Vmax:   109.00 cm/s LVOT Vmean:  72.200 cm/s LVOT VTI:    0.248 m  AORTA Ao Root diam: 4.00 cm  MITRAL VALVE MV Area (PHT): 3.48 cm    SHUNTS MV Decel Time: 218 msec    Systemic VTI:  0.25 m MV E velocity: 75.30 cm/s  Systemic Diam: 2.30 cm MV A velocity: 65.80 cm/s MV E/A ratio:  1.14  Vishnu Priya Mallipeddi Electronically signed by Winfield Rast Mallipeddi Signature Date/Time: 01/09/2023/3:59:11 PM    Final      CT SCANS  CT CORONARY MORPH W/CTA COR W/SCORE 01/10/2023  Addendum 01/10/2023  9:23 PM ADDENDUM REPORT: 01/10/2023 21:21  EXAM: OVER-READ INTERPRETATION  CT CHEST  The following report is an over-read performed by radiologist Dr. Leda Min [facility] on 01/10/2023. This over-read does not  include interpretation of cardiac or coronary anatomy or pathology. The coronary CTA interpretation by the cardiologist is attached.  COMPARISON:  12/31/2022  FINDINGS: No pericardial effusion. Visualized portions of thoracic aorta unremarkable. No pleural effusion. No pneumothorax. Visualized portions of lung fields clear.  IMPRESSION: Negative   Electronically Signed By: Corlis Leak M.D. On: 01/10/2023 21:21  Narrative CLINICAL DATA:  Chest pain  EXAM: Cardiac CTA  MEDICATIONS: Sub lingual nitro. 4mg  x 2  TECHNIQUE: The patient was scanned on a Siemens 192 slice scanner. Gantry rotation speed was 250 msecs. Collimation was 0.6 mm. A 100 kV prospective scan was triggered in the ascending thoracic aorta at 35-75% of the R-R interval. Average HR during the scan was 60 bpm. The 3D data set was interpreted on a dedicated work station using MPR, MIP and VRT modes. A total of 80cc of contrast was used.  FINDINGS: Non-cardiac: See separate report from [facility].  Technically difficult study with poor bolus timing and misregistration artifact.  No LA appendage thrombus. Pulmonary veins drain normally to the left atrium.  Calcium Score: 0 Agatston units  Coronary Arteries: Right dominant with no anomalies  LM: No plaque or stenosis.  LAD system: No plaque or stenosis.  Circumflex system: No plaque or stenosis.  RCA system: Distal RCA evaluation limited by artifact, but probably no plaque or stenosis.  IMPRESSION: 1.  Technically difficult study.  2. Coronary artery calcium score 0 Agatston units, suggesting low risk for future cardiac events.  3.  No significant coronary disease noted.  [employer] Sales promotion account executive  Electronically Signed: By: Marca Ancona M.D. On: 01/10/2023 21:05     ______________________________________________________________________________________________        Recent Labs: 01/10/2023: Magnesium 1.9 10/03/2023: ALT 10; BUN  12; Creatinine, Ser 1.20; Hemoglobin 17.5; Platelets 216; Potassium 4.3; Sodium 144; TSH 4.640  Recent Lipid Panel    Component Value Date/Time   CHOL 80 (L) 10/03/2023 0810   TRIG 66 10/03/2023 0810   HDL 37 (L) 10/03/2023 0810   CHOLHDL 2.2 10/03/2023 0810   CHOLHDL 2.7 01/10/2023 0248   VLDL 14 01/10/2023 0248   LDLCALC 28 10/03/2023 0810   STOP-Bang Score:  8       Physical Exam:    VS:  BP 120/80 (BP Location: Right Arm)   Pulse 61   Ht 6' (1.829 m)   Wt 279 lb (126.6 kg)   SpO2 94%   BMI 37.84 kg/m     Wt Readings from Last 3 Encounters:  11/21/23 279 lb (126.6 kg)  10/02/23 277 lb 6.4 oz (125.8 kg)  09/18/23 277 lb (125.6 kg)     GEN:  Well nourished, well developed in no acute distress HEENT: Normal NECK: No JVD CARDIAC: RRR, no murmurs, rubs, gallops RESPIRATORY:  Clear to auscultation without rales, wheezing or rhonchi  ABDOMEN: Soft, non-tender, non-distended MUSCULOSKELETAL:  Trivial edema lett legNo deformity  SKIN: Warm and dry NEUROLOGIC:  Alert and oriented x 3 PSYCHIATRIC:  Normal affect   ASSESSMENT:    1. Chest pain of uncertain etiology   2. Aortic root dilation (HCC)   3. Essential hypertension   4. PVC's (premature ventricular contractions)     PLAN:    Coronary Artery Disease (CAD) Screening Cardiac CT scan revealed a coronary calcium score of zero, indicating no coronary artery blockages. Despite a family history of heart disease, his current cardiac health is excellent with no blockages or calcium deposits. The natural history suggests potential future development of blockages, but current status is highly favorable. - Consider follow-up in a few years unless symptoms develop.  Premature Ventricular Contractions (PVCs) No recent episodes of PVCs today. Cardiac health is well-controlled with no current symptoms.  Aortic Dilation Previously noted mild aortic dilation without aortic regurgitation. A follow-up CT scan did not confirm the  dilation, indicating no current aortic dilation.  Orthostatic Hypotension Previously experienced orthostatic hypotension and labile blood pressure, which resolved in 2024. Blood pressure is currently well-controlled.  Follow-up He is doing well with excellent cardiac health. No immediate follow-up needed unless symptoms arise. - Schedule check-in every couple of years unless symptoms develop (he was offered graduation).      Medication Adjustments/Labs and Tests Ordered: Current medicines are reviewed at length with the patient today.  Concerns regarding medicines are outlined above.  Orders Placed This Encounter  Procedures   EKG 12-Lead   No orders of the defined types were placed in this encounter.   There are no Patient Instructions on file for this visit.   Signed, Christell Constant, MD  11/21/2023 11:08 AM    [facility]

## 2023-11-22 ENCOUNTER — Ambulatory Visit: Payer: Medicare PPO | Admitting: Podiatry

## 2023-11-22 DIAGNOSIS — L600 Ingrowing nail: Secondary | ICD-10-CM | POA: Diagnosis not present

## 2023-11-22 NOTE — Progress Notes (Unsigned)
 Subjective:  Patient ID: Keith Solis, male    DOB: Aug 25, 1956,  MRN: [id number]  Chief Complaint  Patient presents with   Nail Problem    68 y.o. male presents with the above complaint.  Patient presents with bilateral hallux nail dystrophy with thickened and dystrophic nail with underlying ingrown painful to touch he would like to have removed he would like to make it permanent he denies any other acute complaints he has been doing this for quite some time he is a diabetic with last A1c of 6.2.   Review of Systems: Negative except as noted in the HPI. Denies N/V/F/Ch.  Past Medical History:  Diagnosis Date   Diabetes mellitus (HCC)    Gastritis    GERD (gastroesophageal reflux disease)    Hiatal hernia    Hypertension    Neuropathy    Obesity    Schatzki's ring     Current Outpatient Medications:    albuterol (VENTOLIN HFA) 108 (90 Base) MCG/ACT inhaler, Inhale 2 puffs into the lungs every 6 (six) hours as needed for wheezing or shortness of breath., Disp: , Rfl:    amLODipine (NORVASC) 5 MG tablet, Take 1 tablet (5 mg total) by mouth daily., Disp: 90 tablet, Rfl: 1   aspirin EC 81 MG tablet, Take 81 mg by mouth daily. Swallow whole., Disp: , Rfl:    bisacodyl (DULCOLAX) 10 MG suppository, Place 10 mg rectally as needed for moderate constipation., Disp: , Rfl:    gabapentin (NEURONTIN) 300 MG capsule, Take 1 capsule (300 mg total) by mouth 3 (three) times daily., Disp: 90 capsule, Rfl: 2   levothyroxine (SYNTHROID) 75 MCG tablet, Take 75 mcg by mouth daily before breakfast., Disp: , Rfl:    olmesartan (BENICAR) 40 MG tablet, Take 1 tablet (40 mg total) by mouth daily., Disp: 90 tablet, Rfl: 3   omeprazole (PRILOSEC) 40 MG capsule, Take 1 capsule by mouth twice daily, Disp: 180 capsule, Rfl: 2   psyllium (METAMUCIL) 58.6 % packet, Take 1 packet by mouth daily., Disp: , Rfl:    Wheat Dextrin (BENEFIBER DRINK MIX PO), Take by mouth as needed (constipation)., Disp: , Rfl:    Current Facility-Administered Medications:    testosterone cypionate (DEPOTESTOSTERONE CYPIONATE) injection 200 mg, 200 mg, Intramuscular, Q14 Days, Billie Lade, MD, 200 mg at 10/02/23 1420  Social History   Tobacco Use  Smoking Status Former   Types: Cigarettes   Passive exposure: Never  Smokeless Tobacco Never    No Known Allergies Objective:  There were no vitals filed for this visit. There is no height or weight on file to calculate BMI. Constitutional Well developed. Well nourished.  Vascular Dorsalis pedis pulses palpable bilaterally. Posterior tibial pulses palpable bilaterally. Capillary refill normal to all digits.  No cyanosis or clubbing noted. Pedal hair growth normal.  Neurologic Normal speech. Oriented to person, place, and time. Epicritic sensation to light touch grossly present bilaterally.  Dermatologic Pain on palpation of the entire/total nail on 1st digit of the bilaterally No other open wounds. No skin lesions.  Orthopedic: Normal joint ROM without pain or crepitus bilaterally. No visible deformities. No bony tenderness.   Radiographs: None Assessment:   1. Ingrown toenail of right foot   2. Ingrown left big toenail    Plan:  Patient was evaluated and treated and all questions answered.  Nail contusion/dystrophy bilateral hallux with underlying ingrown, bilaterally -Patient elects to proceed with minor surgery to remove entire toenail today. Consent reviewed and signed by  patient. -Entire/total nail excised. See procedure note. -Educated on post-procedure care including soaking. Written instructions provided and reviewed. -Patient to follow up in 2 weeks for nail check.  Procedure: Excision of entire/total nail with underlying phenol matricectomy Location: Bilateral 1st toe digit Anesthesia: Lidocaine 1% plain; 1.5 mL and Marcaine 0.5% plain; 1.5 mL, digital block. Skin Prep: Betadine. Dressing: Silvadene; telfa; dry, sterile,  compression dressing. Technique: Following skin prep, the toe was exsanguinated and a tourniquet was secured at the base of the toe. The affected nail border was freed and excised.  Phenol matricectomy was performed in standard technique the tourniquet was then removed and sterile dressing applied. Disposition: Patient tolerated procedure well. Patient to return in 2 weeks for follow-up.   No follow-ups on file.

## 2023-11-29 DIAGNOSIS — M1711 Unilateral primary osteoarthritis, right knee: Secondary | ICD-10-CM | POA: Diagnosis not present

## 2023-12-04 ENCOUNTER — Ambulatory Visit: Payer: Medicare PPO | Admitting: Adult Health

## 2023-12-04 ENCOUNTER — Telehealth: Payer: Self-pay | Admitting: Internal Medicine

## 2023-12-04 DIAGNOSIS — E291 Testicular hypofunction: Secondary | ICD-10-CM

## 2023-12-04 MED ORDER — TESTOSTERONE CYPIONATE 200 MG/ML IM SOLN
200.0000 mg | INTRAMUSCULAR | 0 refills | Status: DC
Start: 2023-12-04 — End: 2024-02-13

## 2023-12-04 NOTE — Telephone Encounter (Signed)
 Copied from CRM [id number]. Topic: Clinical - Medication Refill >> Dec 04, 2023  8:24 AM Gery Pray wrote: Most Recent Primary Care Visit:  Provider: Christel Mormon E  Department: [facility]  Visit Type: NEW PATIENT  Date: 10/02/2023  Medication: testosterone cypionate (DEPOTESTOSTERONE CYPIONATE) injection 200 mg  Has the patient contacted their pharmacy? Yes (Agent: If no, request that the patient contact the pharmacy for the refill. If patient does not wish to contact the pharmacy document the reason why and proceed with request.) (Agent: If yes, when and what did the pharmacy advise?) Advised to call the provider's office as they had not gotten a response from the office.  Is this the correct pharmacy for this prescription? Yes If no, delete pharmacy and type the correct one.  This is the patient's preferred pharmacy:  [facility] [address], [place] - [address] [place] [address] [place] [address] Phone: [phone] Fax: [phone]   Has the prescription been filled recently? No  Is the patient out of the medication? Yes  Has the patient been seen for an appointment in the last year OR does the patient have an upcoming appointment? Yes  Can we respond through MyChart? No, Please contact patient at [phone]  Agent: Please be advised that Rx refills may take up to 3 business days. We ask that you follow-up with your pharmacy.

## 2023-12-05 ENCOUNTER — Other Ambulatory Visit (HOSPITAL_COMMUNITY): Payer: Self-pay

## 2023-12-05 ENCOUNTER — Telehealth: Payer: Self-pay | Admitting: Pharmacy Technician

## 2023-12-05 NOTE — Telephone Encounter (Signed)
 Pharmacy Patient Advocate Encounter  Received notification from [facility] that Prior Authorization for Testosterone Cypionate 200MG /ML intramuscular solution has been APPROVED from 12/05/2023 to 12/312025. Unable to obtain price due to refill too soon rejection, last fill date 12/05/2023 next available fill date05/27/2025   PA #/Case ID/Reference #: [id number]

## 2023-12-05 NOTE — Telephone Encounter (Signed)
 Pharmacy Patient Advocate Encounter   Received notification from Patient Pharmacy that prior authorization for Testosterone Cypionate 200MG /ML intramuscular solution is required/requested.   Insurance verification completed.   The patient is insured through [employer] .   Per test claim: PA required; PA submitted to above mentioned insurance via CoverMyMeds Key/confirmation #/EOC [id number] Status is pending

## 2023-12-08 ENCOUNTER — Telehealth: Payer: Self-pay | Admitting: Internal Medicine

## 2023-12-08 NOTE — Telephone Encounter (Signed)
 Patient goes to Dr Charise Killian at [facility] [address]

## 2023-12-08 NOTE — Telephone Encounter (Signed)
Faxed for records

## 2023-12-26 ENCOUNTER — Other Ambulatory Visit: Payer: Self-pay | Admitting: Internal Medicine

## 2023-12-26 DIAGNOSIS — G629 Polyneuropathy, unspecified: Secondary | ICD-10-CM

## 2024-01-01 ENCOUNTER — Encounter: Payer: Self-pay | Admitting: Internal Medicine

## 2024-01-01 ENCOUNTER — Ambulatory Visit: Payer: Medicare PPO | Admitting: Internal Medicine

## 2024-01-01 VITALS — BP 126/70 | HR 67 | Ht 72.0 in | Wt 282.0 lb

## 2024-01-01 DIAGNOSIS — G4733 Obstructive sleep apnea (adult) (pediatric): Secondary | ICD-10-CM

## 2024-01-01 DIAGNOSIS — E1165 Type 2 diabetes mellitus with hyperglycemia: Secondary | ICD-10-CM | POA: Diagnosis not present

## 2024-01-01 DIAGNOSIS — E039 Hypothyroidism, unspecified: Secondary | ICD-10-CM

## 2024-01-01 DIAGNOSIS — E291 Testicular hypofunction: Secondary | ICD-10-CM | POA: Diagnosis not present

## 2024-01-01 DIAGNOSIS — I10 Essential (primary) hypertension: Secondary | ICD-10-CM

## 2024-01-01 DIAGNOSIS — E669 Obesity, unspecified: Secondary | ICD-10-CM

## 2024-01-01 DIAGNOSIS — K219 Gastro-esophageal reflux disease without esophagitis: Secondary | ICD-10-CM

## 2024-01-01 NOTE — Assessment & Plan Note (Signed)
 Suspected diagnoses.  HST ordered but still has not been completed.  Missed pulmonology follow-up last month.  Will request that this appointment is rescheduled.

## 2024-01-01 NOTE — Assessment & Plan Note (Signed)
 Asymptomatic currently.  TSH 4.6 on labs from January.  He remains on levothyroxine 75 mcg daily.

## 2024-01-01 NOTE — Assessment & Plan Note (Signed)
 Currently on testosterone replacement therapy.  Testosterone level at goal on labs from January.  No changes are indicated today.  PDMP reviewed and is appropriate.

## 2024-01-01 NOTE — Assessment & Plan Note (Signed)
 Diet controlled.  A1c 6.2 on labs from January.  No medication changes are indicated today.

## 2024-01-01 NOTE — Assessment & Plan Note (Signed)
 Current weight 282 pounds.  BMI 38.2.  We discussed the need to focus on lifestyle modifications aimed at weight loss.  Follow-up in 6 months for reassessment.

## 2024-01-01 NOTE — Assessment & Plan Note (Signed)
 Symptoms remain well-controlled with omeprazole 40 mg twice daily.

## 2024-01-01 NOTE — Assessment & Plan Note (Signed)
 Remains adequately controlled on current antihypertensive regimen.  No medication changes are indicated today.

## 2024-01-01 NOTE — Progress Notes (Signed)
 Established Patient Office Visit  Subjective   Patient ID: Keith Solis, male    DOB: 10/22/1955  Age: 68 y.o. MRN: [id number]  Chief Complaint  Patient presents with   Hypertension    Three month follow up    Keith Solis returns to care today for routine follow-up.  He was last evaluated by me on 1/13 as a new patient presents to establish care.  No medication changes were made, repeat labs ordered, and 66-month follow-up arranged.  In the interim, he has been seen by cardiology, pulmonology, and sports medicine for follow-up.  There have otherwise been no acute interval events.  Today he reports feeling well and has no acute concerns to discuss.  Past Medical History:  Diagnosis Date   Diabetes mellitus (HCC)    Gastritis    GERD (gastroesophageal reflux disease)    Hiatal hernia    Hypertension    Neuropathy    Obesity    Schatzki's ring    Past Surgical History:  Procedure Laterality Date   BALLOON DILATION N/A 07/19/2022   Procedure: BALLOON DILATION;  Surgeon: Lanelle Bal, DO;  Location: [facility];  Service: Endoscopy;  Laterality: N/A;   BIOPSY  07/19/2022   Procedure: BIOPSY;  Surgeon: Lanelle Bal, DO;  [facility]: [facility];  Service: Endoscopy;;   cholecystectomy N/A    COLONOSCOPY WITH PROPOFOL N/A 07/19/2022   Procedure: COLONOSCOPY WITH PROPOFOL;  Surgeon: Lanelle Bal, DO;  Location: [facility];  Service: Endoscopy;  Laterality: N/A;   ESOPHAGOGASTRODUODENOSCOPY (EGD) WITH PROPOFOL N/A 07/19/2022   Procedure: ESOPHAGOGASTRODUODENOSCOPY (EGD) WITH PROPOFOL;  Surgeon: Lanelle Bal, DO;  Location: [facility];  Service: Endoscopy;  Laterality: N/A;   INGUINAL HERNIA REPAIR     POLYPECTOMY  07/19/2022   Procedure: POLYPECTOMY;  Surgeon: Lanelle Bal, DO;  Location: [facility];  Service: Endoscopy;;   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  HENT:  Negative for sore throat.   Respiratory:  Negative for cough and shortness of breath.   Cardiovascular:  Negative for chest pain, palpitations and leg swelling.  Gastrointestinal:  Negative for abdominal pain, blood in stool, constipation, diarrhea, nausea and vomiting.  Genitourinary:  Negative for dysuria and hematuria.  Musculoskeletal:  Negative for myalgias.  Skin:  Negative for itching and rash.  Neurological:  Negative for dizziness and headaches.  Psychiatric/Behavioral:  Negative for depression and suicidal ideas.      Objective:     BP 126/70   Pulse 67   Ht 6' (1.829 m)   Wt 282 lb (127.9 kg)   SpO2 94%   BMI 38.25 kg/m  BP Readings from Last 3 Encounters:  01/01/24 126/70  11/21/23 120/80  10/02/23 136/71   Physical Exam Vitals reviewed.  Constitutional:      General: He is not  in acute distress.    Appearance: Normal appearance. He is obese. He is not ill-appearing.  HENT:     Head: Normocephalic and atraumatic.     Right Ear: External ear normal.     Left Ear: External ear normal.     Nose: Nose normal. No congestion or rhinorrhea.     Mouth/Throat:     Mouth: Mucous membranes are moist.     Pharynx: Oropharynx is clear.  Eyes:     General: No scleral icterus.    Extraocular Movements: Extraocular movements intact.     Conjunctiva/sclera: Conjunctivae normal.     Pupils: Pupils are equal, round, and reactive to light.  Cardiovascular:     Rate and Rhythm: Normal rate and regular rhythm.     Pulses: Normal pulses.     Heart  sounds: Normal heart sounds. No murmur heard. Pulmonary:     Effort: Pulmonary effort is normal.     Breath sounds: Normal breath sounds. No wheezing, rhonchi or rales.  Abdominal:     General: Abdomen is flat. Bowel sounds are normal. There is no distension.     Palpations: Abdomen is soft.     Tenderness: There is no abdominal tenderness.  Musculoskeletal:        General: No swelling or deformity. Normal range of motion.     Cervical back: Normal range of motion.  Skin:    General: Skin is warm and dry.     Capillary Refill: Capillary refill takes less than 2 seconds.  Neurological:     General: No focal deficit present.     Mental Status: He is alert and oriented to person, place, and time.     Motor: No weakness.  Psychiatric:        Mood and Affect: Mood normal.        Behavior: Behavior normal.        Thought Content: Thought content normal.   Last CBC Lab Results  Component Value Date   WBC 9.2 10/03/2023   HGB 17.5 10/03/2023   HCT 53.2 (H) 10/03/2023   MCV 89 10/03/2023   MCH 29.3 10/03/2023   RDW 12.6 10/03/2023   PLT 216 10/03/2023   Last metabolic panel Lab Results  Component Value Date   GLUCOSE 89 10/03/2023   NA 144 10/03/2023   K 4.3 10/03/2023   CL 102 10/03/2023   CO2 27 10/03/2023   BUN 12 10/03/2023   CREATININE 1.20 10/03/2023   EGFR 67 10/03/2023   CALCIUM 9.3 10/03/2023   PHOS 2.6 01/09/2023   PROT 6.8 10/03/2023   ALBUMIN 4.5 10/03/2023   LABGLOB 2.3 10/03/2023   BILITOT 0.8 10/03/2023   ALKPHOS 74 10/03/2023   AST 12 10/03/2023   ALT 10 10/03/2023   ANIONGAP 11 01/10/2023   Last lipids Lab Results  Component Value Date   CHOL 80 (L) 10/03/2023   HDL 37 (L) 10/03/2023   LDLCALC 28 10/03/2023   TRIG 66 10/03/2023   CHOLHDL 2.2 10/03/2023   Last hemoglobin A1c Lab Results  Component Value Date   HGBA1C 6.2 (H) 10/03/2023   Last thyroid functions Lab Results  Component Value Date   TSH 4.640 (H) 10/03/2023   Last vitamin  D Lab Results  Component Value Date   VD25OH 34.3 10/03/2023   Last vitamin B12 and Folate Lab Results  Component Value Date   VITAMINB12 471 10/03/2023   FOLATE 9.1 10/03/2023     Assessment & Plan:   Problem List  Items Addressed This Visit       Essential hypertension - Primary   Remains adequately controlled on current antihypertensive regimen.  No medication changes are indicated today.      OSA (obstructive sleep apnea)   Suspected diagnoses.  HST ordered but still has not been completed.  Missed pulmonology follow-up last month.  Will request that this appointment is rescheduled.      GERD without esophagitis   Symptoms remain well-controlled with omeprazole 40 mg twice daily.      Acquired hypothyroidism   Asymptomatic currently.  TSH 4.6 on labs from January.  He remains on levothyroxine 75 mcg daily.      Hyperglycemia due to type 2 diabetes mellitus (HCC)   Diet controlled.  A1c 6.2 on labs from January.  No medication changes are indicated today.      Male hypogonadism   Currently on testosterone replacement therapy.  Testosterone level at goal on labs from January.  No changes are indicated today.  PDMP reviewed and is appropriate.      Obesity (BMI 30-39.9)   Current weight 282 pounds.  BMI 38.2.  We discussed the need to focus on lifestyle modifications aimed at weight loss.  Follow-up in 6 months for reassessment.      Return in about 6 months (around 07/02/2024).   Tobi Fortes, MD

## 2024-01-01 NOTE — Patient Instructions (Signed)
It was a pleasure to see you today.  Thank you for giving [place] the opportunity to be involved in your care.  Below is a brief recap of your visit and next steps.  We will plan to see you again in 6 months  Summary No medication changes today Follow up in 6 months

## 2024-01-10 DIAGNOSIS — M546 Pain in thoracic spine: Secondary | ICD-10-CM | POA: Diagnosis not present

## 2024-01-10 DIAGNOSIS — M542 Cervicalgia: Secondary | ICD-10-CM | POA: Diagnosis not present

## 2024-01-10 DIAGNOSIS — M9902 Segmental and somatic dysfunction of thoracic region: Secondary | ICD-10-CM | POA: Diagnosis not present

## 2024-01-10 DIAGNOSIS — M9901 Segmental and somatic dysfunction of cervical region: Secondary | ICD-10-CM | POA: Diagnosis not present

## 2024-01-10 DIAGNOSIS — M9903 Segmental and somatic dysfunction of lumbar region: Secondary | ICD-10-CM | POA: Diagnosis not present

## 2024-02-13 ENCOUNTER — Other Ambulatory Visit: Payer: Self-pay | Admitting: Internal Medicine

## 2024-02-13 DIAGNOSIS — E291 Testicular hypofunction: Secondary | ICD-10-CM

## 2024-02-27 ENCOUNTER — Other Ambulatory Visit: Payer: Self-pay

## 2024-02-27 MED ORDER — LEVOTHYROXINE SODIUM 75 MCG PO TABS: 75.0000 ug | ORAL_TABLET | Freq: Every day | ORAL | 0 refills | Status: DC

## 2024-03-26 ENCOUNTER — Ambulatory Visit: Admitting: Adult Health

## 2024-03-26 ENCOUNTER — Encounter: Payer: Self-pay | Admitting: Adult Health

## 2024-03-26 VITALS — BP 102/82 | HR 82 | Ht 72.0 in | Wt 271.6 lb

## 2024-03-26 DIAGNOSIS — R0683 Snoring: Secondary | ICD-10-CM | POA: Diagnosis not present

## 2024-03-26 DIAGNOSIS — Z87891 Personal history of nicotine dependence: Secondary | ICD-10-CM

## 2024-03-26 NOTE — Patient Instructions (Addendum)
 Set up for home sleep study- we will be in touch to pick up device at our office and return.  Work on healthy weight loss  Do not drive if sleepy  Follow up in 6 weeks to discuss results and treatment plan

## 2024-03-26 NOTE — Progress Notes (Signed)
 @Patient  ID: Keith Solis, male    DOB: 12-25-1955, 68 y.o.   MRN: [id number]  Chief Complaint  Patient presents with   Establish Care   Discussed the use of AI scribe software for clinical note transcription with the patient, who gave verbal consent to proceed.  Referring provider: Melvenia Manus BRAVO, MD  HPI: 68 year old male seen for sleep consult June 19, 2023 for possible sleep apnea.  TEST/EVENTS :   03/26/2024 Follow up: Snoring and daytime sleepiness Patient returns for follow-up visit.  Patient was seen in September for possible sleep apnea.  He had recently been hospitalized and was noted to have nocturnal hypoxemia.  He also had snoring and restless sleep.  Some witnessed apneic event.  He was recommended for a sleep study. He experiences symptoms consistent with sleep apnea, including snoring, restless sleep, and episodes of apnea during sleep. A previous evaluation He was recommended for a sleep study. which he did not complete due to concerns about the cost if the equipment was lost in the mail.  He experiences daytime sleepiness, often napping for about thirty minutes a day, especially after physical activity such as building a deck. He typically goes to bed around midnight and wakes up at 7 AM. He also falls asleep while watching TV.  No history of congestive heart failure or stroke.  He takes gabapentin  at night for neuropathy He denies using any sleep aids or having a history of car accidents due to falling asleep. He does not have a Agricultural consultant.  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 He quit smoking twelve years ago and does not consume alcohol.       No Known Allergies  Immunization History  Administered Date(s) Administered   Fluad Quad(high Dose 65+) 05/11/2023   Influenza,inj,quad, With Preservative 05/06/2016, 06/03/2018   Pneumococcal Polysaccharide-23 01/15/2017    Pneumococcal-Unspecified 09/19/2014   Zoster Recombinant(Shingrix) 06/02/2023    Past Medical History:  Diagnosis Date   Diabetes mellitus (HCC)    Gastritis    GERD (gastroesophageal reflux disease)    Hiatal hernia    Hypertension    Neuropathy    Obesity    Schatzki's ring     Tobacco History: Social History   Tobacco Use  Smoking Status Former   Types: Cigarettes   Passive exposure: Never  Smokeless Tobacco Never   Counseling given: Not Answered   Outpatient Medications Prior to Visit  Medication Sig Dispense Refill   albuterol (VENTOLIN HFA) 108 (90 Base) MCG/ACT inhaler Inhale 2 puffs into the lungs every 6 (six) hours as needed for wheezing or shortness of breath.     amLODipine  (NORVASC ) 5 MG tablet Take 1 tablet (5 mg total) by mouth daily. 90 tablet 1   aspirin  EC 81 MG tablet Take 81 mg by mouth daily. Swallow whole.     gabapentin  (NEURONTIN ) 300 MG capsule TAKE 1 CAPSULE BY MOUTH THREE TIMES DAILY 90 capsule 0   levothyroxine  (SYNTHROID ) 75 MCG tablet Take 1 tablet (75 mcg total) by mouth daily before breakfast. 90 tablet 0   olmesartan  (BENICAR ) 40 MG tablet Take 1 tablet (40 mg total) by mouth daily. 90 tablet 3   omeprazole  (PRILOSEC) 40 MG capsule Take 1 capsule by mouth twice daily 180 capsule 2   psyllium (METAMUCIL) 58.6 % packet Take 1 packet by mouth daily.     testosterone  cypionate (DEPOTESTOSTERONE CYPIONATE) 200 MG/ML injection INJECT 1 ML (200  MG TOTAL) INTO MUSCLE EVERY 14 DAYS 6 mL 0   Wheat Dextrin (BENEFIBER DRINK MIX PO) Take by mouth as needed (constipation).     bisacodyl (DULCOLAX) 10 MG suppository Place 10 mg rectally as needed for moderate constipation.     No facility-administered medications prior to visit.     Review of Systems:   Constitutional:   No  weight loss, night sweats,  Fevers, chills,+ fatigue, or  lassitude.  HEENT:   No headaches,  Difficulty swallowing,  Tooth/dental problems, or  Sore throat,                No  sneezing, itching, ear ache, nasal congestion, post nasal drip,   CV:  No chest pain,  Orthopnea, PND, swelling in lower extremities, anasarca, dizziness, palpitations, syncope.   GI  No heartburn, indigestion, abdominal pain, nausea, vomiting, diarrhea, change in bowel habits, loss of appetite, bloody stools.   Resp: No shortness of breath with exertion or at rest.  No excess mucus, no productive cough,  No non-productive cough,  No coughing up of blood.  No change in color of mucus.  No wheezing.  No chest wall deformity  Skin: no rash or lesions.  GU: no dysuria, change in color of urine, no urgency or frequency.  No flank pain, no hematuria   MS:  No joint pain or swelling.  No decreased range of motion.  No back pain.    Physical Exam  BP 102/82   Pulse 82   Ht 6' (1.829 m)   Wt 271 lb 9.6 oz (123.2 kg)   SpO2 94%   BMI 36.84 kg/m   GEN: A/Ox3; pleasant , NAD, well nourished    HEENT:  [place]/AT, NOSE-clear, THROAT-clear, no lesions, no postnasal drip or exudate noted. Class 3 MP airway   NECK:  Supple w/ fair ROM; no JVD; normal carotid impulses w/o bruits; no thyromegaly or nodules palpated; no lymphadenopathy.    RESP  Clear  P & A; w/o, wheezes/ rales/ or rhonchi. no accessory muscle use, no dullness to percussion  CARD:  RRR, no m/r/g, no peripheral edema, pulses intact, no cyanosis or clubbing.  GI:   Soft & nt; nml bowel sounds; no organomegaly or masses detected.   Musco: Warm bil, no deformities or joint swelling noted.   Neuro: alert, no focal deficits noted.    Skin: Warm, no lesions or rashes    Lab Results:  CBC   BNP No results found for: BNP  ProBNP No results found for: PROBNP  Imaging: No results found.  Administration History     None           No data to display          No results found for: NITRICOXIDE      Assessment & Plan:   No problem-specific Assessment & Plan notes found for this  encounter.   Assessment and Plan    Loud snoring, restless sleep and daytime sleepiness all suspicious for underlying sleep apnea. Suspected sleep apnea -with underlying snoring, restless sleep, daytime somnolence, and apnea episodes.  Recommend a home sleep study.  We will set patient up for our home sleep study device that is given out in the office instead of using the mail order sleep apnea test through [facility] He prefers direct clinic pickup and return of home sleep study equipment. Follow up in office to  Review sleep study results in six weeks.  Morbid obesity -healthy weight loss  discussed   Madelin Stank, NP 03/26/2024

## 2024-03-27 DIAGNOSIS — M9902 Segmental and somatic dysfunction of thoracic region: Secondary | ICD-10-CM | POA: Diagnosis not present

## 2024-03-27 DIAGNOSIS — M9903 Segmental and somatic dysfunction of lumbar region: Secondary | ICD-10-CM | POA: Diagnosis not present

## 2024-03-27 DIAGNOSIS — M546 Pain in thoracic spine: Secondary | ICD-10-CM | POA: Diagnosis not present

## 2024-03-27 DIAGNOSIS — M9901 Segmental and somatic dysfunction of cervical region: Secondary | ICD-10-CM | POA: Diagnosis not present

## 2024-03-27 DIAGNOSIS — M542 Cervicalgia: Secondary | ICD-10-CM | POA: Diagnosis not present

## 2024-03-28 ENCOUNTER — Other Ambulatory Visit: Payer: Self-pay | Admitting: Gastroenterology

## 2024-03-28 DIAGNOSIS — K219 Gastro-esophageal reflux disease without esophagitis: Secondary | ICD-10-CM

## 2024-03-29 DIAGNOSIS — M9902 Segmental and somatic dysfunction of thoracic region: Secondary | ICD-10-CM | POA: Diagnosis not present

## 2024-03-29 DIAGNOSIS — M9901 Segmental and somatic dysfunction of cervical region: Secondary | ICD-10-CM | POA: Diagnosis not present

## 2024-03-29 DIAGNOSIS — M546 Pain in thoracic spine: Secondary | ICD-10-CM | POA: Diagnosis not present

## 2024-03-29 DIAGNOSIS — M542 Cervicalgia: Secondary | ICD-10-CM | POA: Diagnosis not present

## 2024-03-29 DIAGNOSIS — M9903 Segmental and somatic dysfunction of lumbar region: Secondary | ICD-10-CM | POA: Diagnosis not present

## 2024-04-17 ENCOUNTER — Other Ambulatory Visit: Payer: Self-pay | Admitting: Nurse Practitioner

## 2024-04-25 ENCOUNTER — Ambulatory Visit

## 2024-04-25 DIAGNOSIS — R0683 Snoring: Secondary | ICD-10-CM

## 2024-04-30 ENCOUNTER — Ambulatory Visit

## 2024-04-30 VITALS — Ht 72.0 in | Wt 260.0 lb

## 2024-04-30 DIAGNOSIS — Z0000 Encounter for general adult medical examination without abnormal findings: Secondary | ICD-10-CM | POA: Diagnosis not present

## 2024-04-30 DIAGNOSIS — E1165 Type 2 diabetes mellitus with hyperglycemia: Secondary | ICD-10-CM

## 2024-04-30 DIAGNOSIS — F17210 Nicotine dependence, cigarettes, uncomplicated: Secondary | ICD-10-CM

## 2024-04-30 NOTE — Progress Notes (Addendum)
 Subjective:   Keith Solis is a 68 y.o. who presents for a Medicare Wellness preventive visit. As a reminder, Annual Wellness Visits don't include a physical exam, and some assessments may be limited, especially if this visit is performed virtually. We may recommend an in-person follow-up visit with your provider if needed. Visit Complete: Virtual I connected with  Keith Solis on 04/30/24 by a audio enabled telemedicine application and verified that I am speaking with the correct person using two identifiers.  Patient Location: Home  Provider Location: Home Office  I discussed the limitations of evaluation and management by telemedicine. The patient expressed understanding and agreed to proceed.  Vital Signs: Because this visit was a virtual/telehealth visit, some criteria may be missing or patient reported. Any vitals not documented were not able to be obtained and vitals that have been documented are patient reported. VideoDeclined- This patient declined Librarian, academic. Therefore the visit was completed with audio only.  Persons Participating in Visit: Patient.  AWV Questionnaire: No: Patient Medicare AWV questionnaire was not completed prior to this visit.  Cardiac Risk Factors include: advanced age (>11men, >52 women);diabetes mellitus;hypertension;dyslipidemia;male gender;obesity (BMI >30kg/m2)    Objective:    Today's Vitals   04/30/24 1502  Weight: 260 lb (117.9 kg)  Height: 6' (1.829 m)  PainSc: 0-No pain   Body mass index is 35.26 kg/m.     04/30/2024    3:04 PM 01/10/2023    1:33 AM 07/19/2022    7:37 AM 04/15/2022    9:34 AM  Advanced Directives  Does Patient Have a Medical Advance Directive? Yes No Yes No  Type of Estate agent of [place];Living will  Healthcare Power of [place];Living will   Copy of Healthcare Power of Attorney in Chart? No - copy requested     Would patient like information on  creating a medical advance directive?  No - Patient declined  No - Patient declined   Current Medications (verified) Outpatient Encounter Medications as of 04/30/2024  Medication Sig   albuterol (VENTOLIN HFA) 108 (90 Base) MCG/ACT inhaler Inhale 2 puffs into the lungs every 6 (six) hours as needed for wheezing or shortness of breath.   amLODipine  (NORVASC ) 5 MG tablet Take 1 tablet (5 mg total) by mouth daily.   bisacodyl (DULCOLAX) 10 MG suppository Place 10 mg rectally as needed for moderate constipation.   gabapentin  (NEURONTIN ) 300 MG capsule TAKE 1 CAPSULE BY MOUTH THREE TIMES DAILY   levothyroxine  (SYNTHROID ) 75 MCG tablet Take 1 tablet (75 mcg total) by mouth daily before breakfast.   olmesartan  (BENICAR ) 40 MG tablet Take 1 tablet (40 mg total) by mouth daily.   omeprazole  (PRILOSEC) 40 MG capsule Take 1 capsule by mouth twice daily   psyllium (METAMUCIL) 58.6 % packet Take 1 packet by mouth daily.   testosterone  cypionate (DEPOTESTOSTERONE CYPIONATE) 200 MG/ML injection INJECT 1 ML (200 MG TOTAL) INTO MUSCLE EVERY 14 DAYS   Wheat Dextrin (BENEFIBER DRINK MIX PO) Take by mouth as needed (constipation).   aspirin  EC 81 MG tablet Take 81 mg by mouth daily. Swallow whole. (Patient not taking: Reported on 04/30/2024)   No facility-administered encounter medications on file as of 04/30/2024.   Allergies (verified) Patient has no known allergies.   History:  Past Medical History:  Diagnosis Date   Diabetes mellitus (HCC)    Gastritis    GERD (gastroesophageal reflux disease)    Hiatal hernia    Hypertension  Neuropathy    Obesity    Schatzki's ring    Past Surgical History:  Procedure Laterality Date   BALLOON DILATION N/A 07/19/2022   Procedure: BALLOON DILATION;  Surgeon: Cindie Carlin POUR, DO;  [facility];  Service: Endoscopy;  Laterality: N/A;   BIOPSY  07/19/2022   Procedure: BIOPSY;  Surgeon: Cindie Carlin POUR, DO;  [facility];  Service:  Endoscopy;;   cholecystectomy N/A    COLONOSCOPY WITH PROPOFOL  N/A 07/19/2022   Procedure: COLONOSCOPY WITH PROPOFOL ;  Surgeon: Cindie Carlin POUR, DO;  [facility];  Service: Endoscopy;  Laterality: N/A;   ESOPHAGOGASTRODUODENOSCOPY (EGD) WITH PROPOFOL  N/A 07/19/2022   Procedure: ESOPHAGOGASTRODUODENOSCOPY (EGD) WITH PROPOFOL ;  Surgeon: Cindie Carlin POUR, DO;  [facility];  Service: Endoscopy;  Laterality: N/A;   INGUINAL HERNIA REPAIR     POLYPECTOMY  07/19/2022   Procedure: POLYPECTOMY;  Surgeon: Cindie Carlin POUR, DO;  [facility];  Service: Endoscopy;;   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   Family History  Problem Relation Age of Onset   Colon polyps Mother    Colon polyps Father    Colon polyps Sister    Colon polyps Sister    Colon cancer Neg Hx    Social History   Socioeconomic History   Marital status: Married    Spouse name: Not on file   Number of children: Not on file   Years of education: Not on file   Highest education level: Not on file  Occupational History   Not on file  Tobacco Use   Smoking status: Former    Current packs/day: 0.00    Average packs/day: 1 pack/day for 24.0 years (24.0 ttl pk-yrs)    Types: Cigarettes    Start date: 69    Quit date: 2013    Years since quitting: 12.6    Passive exposure: Never   Smokeless tobacco: Never  Vaping Use   Vaping status: Never Used  Substance and Sexual Activity   Alcohol use: Not Currently   Drug use: Never   Sexual activity: Yes  Other Topics Concern   Not on file  Social History Narrative   Not on file   Social Drivers of Health   Financial Resource Strain: Low Risk  (04/30/2024)   Overall Financial Resource Strain (CARDIA)    Difficulty of Paying Living Expenses: Not hard at all  Food Insecurity: No Food Insecurity (04/30/2024)   Hunger Vital Sign    Worried About Running Out of Food in the Last Year: Never true    Ran Out of Food in the Last Year: Never  true  Transportation Needs: No Transportation Needs (04/30/2024)   PRAPARE - Administrator, Civil Service (Medical): No    Lack of Transportation (Non-Medical): No  Physical Activity: Sufficiently Active (04/30/2024)   Exercise Vital Sign    Days of Exercise per Week: 7 days    Minutes of Exercise per Session: 30 min  Stress: No Stress Concern Present (04/30/2024)   [employer] of Occupational Health - Occupational Stress Questionnaire    Feeling of Stress: Not at all  Social Connections: Moderately Integrated (04/30/2024)   Social Connection and Isolation Panel    Frequency of Communication with Friends and Family: More than three times a week    Frequency of Social Gatherings with Friends and Family: Twice a week    Attends Religious Services: More than 4 times per year    Active  Member of Clubs or Organizations: No    Attends Engineer, structural: Never    Marital Status: Married   Tobacco Counseling Counseling given: Yes  Clinical Intake: Pre-visit preparation completed: Yes Pain : No/denies pain Pain Score: 0-No pain   BMI - recorded: 35.26 Nutritional Status: BMI > 30  Obese Nutritional Risks: None Diabetes: No (per patient he is not a diabetic) Lab Results  Component Value Date   HGBA1C 6.2 (H) 10/03/2023   HGBA1C 5.9 (H) 01/09/2023    How often do you need to have someone help you when you read instructions, pamphlets, or other written materials from your doctor or pharmacy?: 1 - Never Interpreter Needed?: No Information entered by :: [facility]  Activities of Daily Living     04/30/2024    3:04 PM  In your present state of health, do you have any difficulty performing the following activities:  Hearing? 0  Vision? 0  Difficulty concentrating or making decisions? 0  Walking or climbing stairs? 0  Dressing or bathing? 0  Doing errands, shopping? 0  Preparing Food and eating ? N  Using the Toilet? N  In the past six months, have  you accidently leaked urine? N  Do you have problems with loss of bowel control? N  Managing your Medications? N  Managing your Finances? N  Housekeeping or managing your Housekeeping? N    Patient Care Team: Melvenia Manus BRAVO, MD as PCP - General (Internal Medicine) Parrett, Madelin RAMAN, NP as Nurse Practitioner (Pulmonary Disease) Santo Stanly LABOR, MD as Consulting Physician (Cardiology) Darroll Anes, DO (Optometry) Jude Harden GAILS, MD as Consulting Physician (Pulmonary Disease) Waddell Cough, DC as Physician Assistant (Chiropractic Medicine) Silver Laurell Redbird, [place] as Physician Assistant (Orthopedic Surgery) Tobie Franky SQUIBB, DPM as Consulting Physician (Podiatry) Kennedy Charmaine CROME, NP as Nurse Practitioner (Gastroenterology) Hyacinth Honey, NP as Nurse Practitioner (Family Medicine)  I have updated your Care Teams any recent Medical Services you may have received from other providers in the past year.     Assessment:   This is a routine wellness examination for [place].  Hearing/Vision screen Hearing Screening - Comments:: Patient denies any hearing difficulties.   Vision Screening - Comments:: Wears rx glasses - up to date with routine eye exams with  Anes Darroll @ [facility] [place] location   Goals Addressed               This Visit's Progress     Activity and Exercise Increased (pt-stated)        I want to remain active and healthy and independent.        Depression Screen     04/30/2024    3:11 PM 10/02/2023    2:27 PM 10/20/2022    3:17 PM  PHQ 2/9 Scores  PHQ - 2 Score 0 0 0  PHQ- 9 Score 0 2     Fall Risk     04/30/2024    3:10 PM 10/02/2023    1:04 PM 10/20/2022    3:17 PM  Fall Risk   Falls in the past year? 0 0 0  Number falls in past yr: 0 0 0  Injury with Fall? 0 0 0  Risk for fall due to : No Fall Risks No Fall Risks   Follow up Falls evaluation completed;Education provided;Falls prevention discussed Falls evaluation completed      MEDICARE RISK AT HOME:  Medicare Risk at Home Any stairs in or around the  home?: Yes If so, are there any without handrails?: No Home free of loose throw rugs in walkways, pet beds, electrical cords, etc?: Yes Adequate lighting in your home to reduce risk of falls?: Yes Life alert?: No Use of a cane, walker or w/c?: No Grab bars in the bathroom?: Yes Shower chair or bench in shower?: No Elevated toilet seat or a handicapped toilet?: Yes  TIMED UP AND GO:  Was the test performed?  No  Cognitive Function: 6CIT completed        04/30/2024    3:11 PM  6CIT Screen  What Year? 0 points  What month? 0 points  What time? 0 points  Count back from 20 0 points  Months in reverse 0 points  Repeat phrase 0 points  Total Score 0 points    Immunizations Immunization History  Administered Date(s) Administered   Fluad Quad(high Dose 65+) 05/11/2023   Influenza,inj,quad, With Preservative 05/06/2016, 06/03/2018   Pneumococcal Polysaccharide-23 01/15/2017   Pneumococcal-Unspecified 09/19/2014   Zoster Recombinant(Shingrix) 06/02/2023    Screening Tests Health Maintenance  Topic Date Due   COVID-19 Vaccine (1) Never done   FOOT EXAM  Never done   OPHTHALMOLOGY EXAM  Never done   DTaP/Tdap/Td (1 - Tdap) Never done   Pneumococcal Vaccine: 50+ Years (2 of 2 - PCV) 01/15/2018   Zoster Vaccines- Shingrix (2 of 2) 07/28/2023   Lung Cancer Screening  01/10/2024   HEMOGLOBIN A1C  04/01/2024   INFLUENZA VACCINE  04/19/2024   Diabetic kidney evaluation - eGFR measurement  10/02/2024   Diabetic kidney evaluation - Urine ACR  10/02/2024   Medicare Annual Wellness (AWV)  04/30/2025   Colonoscopy  07/20/2027   Hepatitis C Screening  Completed   Hepatitis B Vaccines  Aged Out   HPV VACCINES  Aged Out   Meningococcal B Vaccine  Aged Out    Health Maintenance  Health Maintenance Due  Topic Date Due   COVID-19 Vaccine (1) Never done   FOOT EXAM  Never done   OPHTHALMOLOGY EXAM   Never done   DTaP/Tdap/Td (1 - Tdap) Never done   Pneumococcal Vaccine: 50+ Years (2 of 2 - PCV) 01/15/2018   Zoster Vaccines- Shingrix (2 of 2) 07/28/2023   Lung Cancer Screening  01/10/2024   HEMOGLOBIN A1C  04/01/2024   INFLUENZA VACCINE  04/19/2024   Health Maintenance Items Addressed: Referral sent to [facility] (smoker/hx smoking), Diabetic Foot Exam recommended, Labs Ordered: A1C. Referral placed to podiatry.  Last diabetic eye exam requested from Oneil Kawasaki w/ [facility] at the [facility]  Additional Screening:  Vision Screening: Recommended annual ophthalmology exams for early detection of glaucoma and other disorders of the eye. Would you like a referral to an eye doctor? No    Dental Screening: Recommended annual dental exams for proper oral hygiene  Community Resource Referral / Chronic Care Management: CRR required this visit?  No   CCM required this visit?  No  Plan:   I have personally reviewed and noted the following in the patient's chart:   Medical and social history Use of alcohol, tobacco or illicit drugs  Current medications and supplements including opioid prescriptions. Patient is not currently taking opioid prescriptions. Functional ability and status Nutritional status Physical activity Advanced directives List of other physicians Hospitalizations, surgeries, and ER visits in previous 12 months Vitals Screenings to include cognitive, depression, and falls Referrals and appointments  In addition, I have reviewed and discussed with patient certain preventive protocols,  quality metrics, and best practice recommendations. A written personalized care plan for preventive services as well as general preventive health recommendations were provided to patient.   Bertrum Helmstetter, CMA   04/30/2024   After Visit Summary: (Mail) Due to this being a telephonic visit, the after visit summary with patients personalized plan was offered to  patient via mail   Notes: Nothing significant to report at this time.

## 2024-04-30 NOTE — Patient Instructions (Addendum)
 Keith Solis , Thank you for taking time out of your busy schedule to complete your Annual Wellness Visit with me. I enjoyed our conversation and look forward to speaking with you again next year. I, as well as your care team,  appreciate your ongoing commitment to your health goals. Please review the following plan we discussed and let me know if I can assist you in the future. Your Game plan/ To Do List    Referrals: If you haven't heard from the office you've been referred to, please reach out to them at the phone provided.   Podiatry Referral [facility] at [facility] [address] [place],  [place]  [address] Phone: [phone]  Lung Cancer Screening-[place] Office [address] Floor Medical Building directly across from [facility] Phone Number:[phone]   Lab Orders         Hemoglobin A1c    Have these labs completed at [facility]. No appointment needed.   Follow up Visits: We will see or speak with you next year for your Next Medicare AWV with our clinical staff May 05, 2025 at 8:00 am telephone visit.   Clinician Recommendations:  Aim for 30 minutes of exercise or brisk walking, 6-8 glasses of water, and 5 servings of fruits and vegetables each day.    Wishing you many blessings and good health during the next year until our next visit.  -Emoni Whitworth   This is a list of the screenings recommended for you:  Health Maintenance  Topic Date Due   COVID-19 Vaccine (1) Never done   Complete foot exam   Never done   Eye exam for diabetics  Never done   DTaP/Tdap/Td vaccine (1 - Tdap) Never done   Pneumococcal Vaccine for age over 23 (2 of 2 - PCV) 01/15/2018   Zoster (Shingles) Vaccine (2 of 2) 07/28/2023   Screening for Lung Cancer  01/10/2024   Hemoglobin A1C  04/01/2024   Flu Shot  04/19/2024   Yearly kidney function blood test for diabetes  10/02/2024   Yearly kidney health urinalysis for diabetes  10/02/2024    Medicare Annual Wellness Visit  04/30/2025   Colon Cancer Screening  07/19/2032   Hepatitis C Screening  Completed   Hepatitis B Vaccine  Aged Out   HPV Vaccine  Aged Out   Meningitis B Vaccine  Aged Out    Advanced directives: (Copy Requested) Please bring a copy of your health care power of attorney and living will to the office to be added to your chart at your convenience. You can mail to [facility] [address] [place], [place] [address] or email to [email or web address] Advance Care Planning is important because it:  [x]  Makes sure you receive the medical care that is consistent with your values, goals, and preferences  [x]  It provides guidance to your family and loved ones and reduces their decisional burden about whether or not they are making the right decisions based on your wishes.  Follow the link provided in your after visit summary or read over the paperwork we have mailed to you to help you started getting your Advance Directives in place. If you need assistance in completing these, please reach out to us  so that we can help you!  See attachments for Preventive Care and Fall Prevention Tips.

## 2024-05-06 ENCOUNTER — Other Ambulatory Visit: Payer: Self-pay

## 2024-05-06 DIAGNOSIS — G629 Polyneuropathy, unspecified: Secondary | ICD-10-CM

## 2024-05-06 MED ORDER — GABAPENTIN 300 MG PO CAPS: 300.0000 mg | ORAL_CAPSULE | Freq: Three times a day (TID) | ORAL | 1 refills | Status: DC

## 2024-05-06 MED ORDER — AMLODIPINE BESYLATE 5 MG PO TABS: 5.0000 mg | ORAL_TABLET | Freq: Every day | ORAL | 1 refills | Status: AC

## 2024-05-06 NOTE — Telephone Encounter (Signed)
 Copied from CRM [id number]. Topic: Clinical - Medication Refill >> May 06, 2024  8:56 AM Myrick T wrote:  Patient has an appt in Oct but has ran out of meds and his provider is no longer at the practice  Medication: amLODipine  (NORVASC ) 5 MG tablet and gabapentin  (NEURONTIN ) 300 MG capsule  Has the patient contacted their pharmacy? Yes  This is the patient's preferred pharmacy:  [facility] [address], [place] - [address] [place] [address] Phone: [phone] Fax: [phone]  Is this the correct pharmacy for this prescription? Yes  Has the prescription been filled recently? Yes  Is the patient out of the medication? Yes  Has the patient been seen for an appointment in the last year OR does the patient have an upcoming appointment? Yes  Can we respond through MyChart? No  Agent: Please be advised that Rx refills may take up to 3 business days. We ask that you follow-up with your pharmacy.

## 2024-05-08 ENCOUNTER — Telehealth: Payer: Self-pay | Admitting: Pulmonary Disease

## 2024-05-08 DIAGNOSIS — G4733 Obstructive sleep apnea (adult) (pediatric): Secondary | ICD-10-CM | POA: Diagnosis not present

## 2024-05-08 NOTE — Telephone Encounter (Signed)
 Called PT and offered him a Friday slot with NP.Tammy. Pt is scheduled nfn.

## 2024-05-08 NOTE — Telephone Encounter (Signed)
 Please set up for office visit this week virtual or in person to discuss sleep study results and treatment options.  Can double book if needed

## 2024-05-08 NOTE — Telephone Encounter (Signed)
 Call patient  Sleep study result  Date of study: 04/25/2024  Impression: Severe obstructive sleep apnea with severe oxygen desaturations.  AHI of 34.3 with O2 nadir of 63%.  The saturations were below 89% for 138 minutes  Recommendation: DME referral  Recommend CPAP therapy for severe obstructive sleep apnea.  Auto-titrating CPAP with pressure settings of 5-20 should be appropriate, along with heated humidification using the patient's preferred mask.  Encourage weight loss measures.  Schedule follow-up in the office 4 to 6 weeks after initiation. of treatment

## 2024-05-10 ENCOUNTER — Encounter: Payer: Self-pay | Admitting: Adult Health

## 2024-05-10 ENCOUNTER — Ambulatory Visit: Admitting: Adult Health

## 2024-05-10 VITALS — BP 126/70 | Ht 72.0 in | Wt 276.8 lb

## 2024-05-10 DIAGNOSIS — E669 Obesity, unspecified: Secondary | ICD-10-CM | POA: Diagnosis not present

## 2024-05-10 DIAGNOSIS — G4733 Obstructive sleep apnea (adult) (pediatric): Secondary | ICD-10-CM

## 2024-05-10 DIAGNOSIS — Z6837 Body mass index (BMI) 37.0-37.9, adult: Secondary | ICD-10-CM | POA: Diagnosis not present

## 2024-05-10 NOTE — Assessment & Plan Note (Signed)
 Severe obstructive sleep apnea.  We discussed his sleep study results in detail.  Went over potential complications of untreated sleep apnea.  Went over treatment options including weight loss, CPAP therapy and inspire device. Patient would like to proceed with CPAP therapy.  Begin auto CPAP 5 to 20 cm H2O.  Order sent to DME company encouraged on healthy weight loss.  - discussed how weight can impact sleep and risk for sleep disordered breathing - discussed options to assist with weight loss: combination of diet modification, cardiovascular and strength training exercises   - had an extensive discussion regarding the adverse health consequences related to untreated sleep disordered breathing - specifically discussed the risks for hypertension, coronary artery disease, cardiac dysrhythmias, cerebrovascular disease, and diabetes - lifestyle modification discussed   - discussed how sleep disruption can increase risk of accidents, particularly when driving - safe driving practices were discussed   Plan  Patient Instructions  Begin CPAP At bedtime, wear all night long  Work on healthy weight loss  Do not drive if sleepy  Follow up in 3 months and As needed

## 2024-05-10 NOTE — Patient Instructions (Addendum)
Begin CPAP At bedtime , wear all night long.  Work on healthy weight loss  Do not drive if sleepy  Follow up in 3 months and As needed   

## 2024-05-10 NOTE — Assessment & Plan Note (Signed)
Encouraged on healthy weight loss 

## 2024-05-10 NOTE — Progress Notes (Signed)
 @Patient  ID: Keith Solis, male    DOB: November 24, 1955, 68 y.o.   MRN: [id number]  Chief Complaint  Patient presents with   Snoring    F/u HST    Referring provider: Melvenia Manus BRAVO, MD  HPI: 68 yo male seen for sleep consult 06/19/23 for snoring found to have severe OSA  TEST/EVENTS :   05/10/2024 Follow up : OSA  Patient presents for a 6-week follow-up.  Patient was seen last visit for ongoing symptoms of snoring and restless sleep.  Patient was set up for a sleep study.  That was completed on April 25, 2024 that showed severe sleep apnea with a AHI at 34.3/hour and SpO2 low at 63%.  O2 saturations were below 88% 138 min.  We discussed his sleep study results in detail.  Patient has underlying hypertension. , Current weight is at 276 pounds with a BMI of 37.  No Known Allergies  Immunization History  Administered Date(s) Administered   Fluad Quad(high Dose 65+) 05/11/2023, 05/07/2024   Influenza,inj,quad, With Preservative 05/06/2016, 06/03/2018   Pneumococcal Polysaccharide-23 01/15/2017   Pneumococcal-Unspecified 09/19/2014   Zoster Recombinant(Shingrix) 06/02/2023    Past Medical History:  Diagnosis Date   Diabetes mellitus (HCC)    Gastritis    GERD (gastroesophageal reflux disease)    Hiatal hernia    Hypertension    Neuropathy    Obesity    Schatzki's ring     Tobacco History: Social History   Tobacco Use  Smoking Status Former   Current packs/day: 0.00   Average packs/day: 1 pack/day for 24.0 years (24.0 ttl pk-yrs)   Types: Cigarettes   Start date: 73   Quit date: 2013   Years since quitting: 12.6   Passive exposure: Never  Smokeless Tobacco Never   Counseling given: Not Answered   Outpatient Medications Prior to Visit  Medication Sig Dispense Refill   albuterol (VENTOLIN HFA) 108 (90 Base) MCG/ACT inhaler Inhale 2 puffs into the lungs every 6 (six) hours as needed for wheezing or shortness of breath.     amLODipine  (NORVASC ) 5 MG tablet Take  1 tablet (5 mg total) by mouth daily. 90 tablet 1   gabapentin  (NEURONTIN ) 300 MG capsule Take 1 capsule (300 mg total) by mouth 3 (three) times daily. 90 capsule 1   levothyroxine  (SYNTHROID ) 75 MCG tablet Take 1 tablet (75 mcg total) by mouth daily before breakfast. 90 tablet 0   olmesartan  (BENICAR ) 40 MG tablet Take 1 tablet (40 mg total) by mouth daily. 90 tablet 3   omeprazole  (PRILOSEC) 40 MG capsule Take 1 capsule by mouth twice daily 180 capsule 0   testosterone  cypionate (DEPOTESTOSTERONE CYPIONATE) 200 MG/ML injection INJECT 1 ML (200 MG TOTAL) INTO MUSCLE EVERY 14 DAYS 6 mL 0   aspirin  EC 81 MG tablet Take 81 mg by mouth daily. Swallow whole. (Patient not taking: Reported on 05/10/2024)     bisacodyl (DULCOLAX) 10 MG suppository Place 10 mg rectally as needed for moderate constipation. (Patient not taking: Reported on 05/10/2024)     psyllium (METAMUCIL) 58.6 % packet Take 1 packet by mouth daily. (Patient not taking: Reported on 05/10/2024)     Wheat Dextrin (BENEFIBER DRINK MIX PO) Take by mouth as needed (constipation). (Patient not taking: Reported on 05/10/2024)     No facility-administered medications prior to visit.     Review of Systems:   Constitutional:   No  weight loss, night sweats,  Fevers, chills, +fatigue, or  lassitude.  HEENT:  No headaches,  Difficulty swallowing,  Tooth/dental problems, or  Sore throat,                No sneezing, itching, ear ache, nasal congestion, post nasal drip,   CV:  No chest pain,  Orthopnea, PND, swelling in lower extremities, anasarca, dizziness, palpitations, syncope.   GI  No heartburn, indigestion, abdominal pain, nausea, vomiting, diarrhea, change in bowel habits, loss of appetite, bloody stools.   Resp:    No wheezing.  No chest wall deformity  Skin: no rash or lesions.  GU: no dysuria, change in color of urine, no urgency or frequency.  No flank pain, no hematuria   MS:  No joint pain or swelling.  No decreased range of  motion.  No back pain.    Physical Exam  BP 126/70   Ht 6' (1.829 m)   Wt 276 lb 12.8 oz (125.6 kg)   SpO2 99% Comment: RA  BMI 37.54 kg/m   GEN: A/Ox3; pleasant , NAD, well nourished    HEENT:  [place]/AT,  NOSE-clear, THROAT-clear, no lesions, no postnasal drip or exudate noted.   NECK:  Supple w/ fair ROM; no JVD; normal carotid impulses w/o bruits; no thyromegaly or nodules palpated; no lymphadenopathy.    RESP  Clear  P & A; w/o, wheezes/ rales/ or rhonchi. no accessory muscle use, no dullness to percussion  CARD:  RRR, no m/r/g, no peripheral edema, pulses intact, no cyanosis or clubbing.  GI:   Soft & nt; nml bowel sounds; no organomegaly or masses detected.   Musco: Warm bil, no deformities or joint swelling noted.   Neuro: alert, no focal deficits noted.    Skin: Warm, no lesions or rashes    Lab Results:   BNP No results found for: BNP  ProBNP No results found for: PROBNP  Imaging: No results found.  Administration History     None           No data to display          No results found for: NITRICOXIDE      Assessment & Plan:   OSA (obstructive sleep apnea) Severe obstructive sleep apnea.  We discussed his sleep study results in detail.  Went over potential complications of untreated sleep apnea.  Went over treatment options including weight loss, CPAP therapy and inspire device. Patient would like to proceed with CPAP therapy.  Begin auto CPAP 5 to 20 cm H2O.  Order sent to DME company encouraged on healthy weight loss.  - discussed how weight can impact sleep and risk for sleep disordered breathing - discussed options to assist with weight loss: combination of diet modification, cardiovascular and strength training exercises   - had an extensive discussion regarding the adverse health consequences related to untreated sleep disordered breathing - specifically discussed the risks for hypertension, coronary artery disease, cardiac  dysrhythmias, cerebrovascular disease, and diabetes - lifestyle modification discussed   - discussed how sleep disruption can increase risk of accidents, particularly when driving - safe driving practices were discussed   Plan  Patient Instructions  Begin CPAP At bedtime, wear all night long  Work on healthy weight loss  Do not drive if sleepy  Follow up in 3 months and As needed       Obesity (BMI 30-39.9) Encouraged on healthy weight loss   Madelin Stank, NP 05/10/2024

## 2024-05-17 ENCOUNTER — Other Ambulatory Visit: Payer: Self-pay | Admitting: Internal Medicine

## 2024-05-17 DIAGNOSIS — E291 Testicular hypofunction: Secondary | ICD-10-CM

## 2024-05-21 ENCOUNTER — Telehealth: Payer: Self-pay

## 2024-05-21 NOTE — Telephone Encounter (Signed)
 Copied from CRM #[id number]. Topic: Clinical - Prescription Issue >> May 21, 2024 10:38 AM Delon DASEN wrote: Reason for CRM: testosterone  cypionate- pharmacy states they did not receive refill

## 2024-05-22 ENCOUNTER — Other Ambulatory Visit: Payer: Self-pay

## 2024-05-22 ENCOUNTER — Ambulatory Visit: Admitting: Podiatry

## 2024-05-22 DIAGNOSIS — L853 Xerosis cutis: Secondary | ICD-10-CM

## 2024-05-22 DIAGNOSIS — E291 Testicular hypofunction: Secondary | ICD-10-CM

## 2024-05-22 MED ORDER — AMMONIUM LACTATE 12 % EX LOTN: 1.0000 | TOPICAL_LOTION | CUTANEOUS | 0 refills | Status: AC | PRN

## 2024-05-22 NOTE — Progress Notes (Signed)
 Subjective:  Patient ID: Keith Solis, male    DOB: May 09, 1956,  MRN: [id number]  Chief Complaint  Patient presents with   Diabetes    Pt stated that he is here to have a foot exam he denies any pain at this time    68 y.o. male presents with the above complaint.  Patient presents with complaint of severe dryness.  Patient states that it has been present for quite some time is causing some discomfort and fissuring wanted get it evaluated patient has 5 out of 10 dull aching nature has not seen MRIs prior to seeing me denies any other acute complaints.   Review of Systems: Negative except as noted in the HPI. Denies N/V/F/Ch.  Past Medical History:  Diagnosis Date   Diabetes mellitus (HCC)    Gastritis    GERD (gastroesophageal reflux disease)    Hiatal hernia    Hypertension    Neuropathy    Obesity    Schatzki's ring     Current Outpatient Medications:    ammonium lactate  (AMLACTIN DAILY) 12 % lotion, Apply 1 Application topically as needed., Disp: 400 g, Rfl: 0   albuterol (VENTOLIN HFA) 108 (90 Base) MCG/ACT inhaler, Inhale 2 puffs into the lungs every 6 (six) hours as needed for wheezing or shortness of breath., Disp: , Rfl:    amLODipine  (NORVASC ) 5 MG tablet, Take 1 tablet (5 mg total) by mouth daily., Disp: 90 tablet, Rfl: 1   aspirin  EC 81 MG tablet, Take 81 mg by mouth daily. Swallow whole. (Patient not taking: Reported on 05/10/2024), Disp: , Rfl:    bisacodyl (DULCOLAX) 10 MG suppository, Place 10 mg rectally as needed for moderate constipation. (Patient not taking: Reported on 05/10/2024), Disp: , Rfl:    gabapentin  (NEURONTIN ) 300 MG capsule, Take 1 capsule (300 mg total) by mouth 3 (three) times daily., Disp: 90 capsule, Rfl: 1   levothyroxine  (SYNTHROID ) 75 MCG tablet, Take 1 tablet (75 mcg total) by mouth daily before breakfast., Disp: 90 tablet, Rfl: 0   olmesartan  (BENICAR ) 40 MG tablet, Take 1 tablet (40 mg total) by mouth daily., Disp: 90 tablet, Rfl: 3    omeprazole  (PRILOSEC) 40 MG capsule, Take 1 capsule by mouth twice daily, Disp: 180 capsule, Rfl: 0   psyllium (METAMUCIL) 58.6 % packet, Take 1 packet by mouth daily. (Patient not taking: Reported on 05/10/2024), Disp: , Rfl:    testosterone  cypionate (DEPOTESTOSTERONE CYPIONATE) 200 MG/ML injection, INJECT 1 ML (CC) INTRAMUSCULARLY EVERY 14 DAYS, Disp: 6 mL, Rfl: 0   Wheat Dextrin (BENEFIBER DRINK MIX PO), Take by mouth as needed (constipation). (Patient not taking: Reported on 05/10/2024), Disp: , Rfl:   Social History   Tobacco Use  Smoking Status Former   Current packs/day: 0.00   Average packs/day: 1 pack/day for 24.0 years (24.0 ttl pk-yrs)   Types: Cigarettes   Start date: 50   Quit date: 2013   Years since quitting: 12.6   Passive exposure: Never  Smokeless Tobacco Never    No Known Allergies Objective:  There were no vitals filed for this visit. There is no height or weight on file to calculate BMI. Constitutional Well developed. Well nourished.  Vascular Dorsalis pedis pulses palpable bilaterally. Posterior tibial pulses palpable bilaterally. Capillary refill normal to all digits.  No cyanosis or clubbing noted. Pedal hair growth normal.  Neurologic Normal speech. Oriented to person, place, and time. Epicritic sensation to light touch grossly present bilaterally.  Dermatologic Nails thickened dystrophic mycotic nail  x 10 Severe dryness noted to bilateral lower extremity.  Skin fissures noted on the heel.  No deep wounds noted.  No pain noted.  No signs of infection noted  Orthopedic: Normal joint ROM without pain or crepitus bilaterally. No visible deformities. No bony tenderness.   Radiographs: None Assessment:  No diagnosis found. Plan:  Patient was evaluated and treated and all questions answered.  Severe xerosis bilateral feet okay - TherI explained to the patient the etiology of xerosis and various treatment options were extensively discussed.  I  explained to the patient the importance of maintaining moisturization of the skin with application of over-the-counter lotion such as Eucerin or Luciderm. given the amount of lotion that is present patient will benefit from ammonium lactate  to be applied twice a day morning lactate was sent to the pharmacy she states understanding applied twice a day  No follow-ups on file.

## 2024-05-22 NOTE — Telephone Encounter (Signed)
 Confirmed with [facility] that pt picked up script today

## 2024-05-28 DIAGNOSIS — Z8249 Family history of ischemic heart disease and other diseases of the circulatory system: Secondary | ICD-10-CM | POA: Diagnosis not present

## 2024-05-28 DIAGNOSIS — Z833 Family history of diabetes mellitus: Secondary | ICD-10-CM | POA: Diagnosis not present

## 2024-05-28 DIAGNOSIS — M1990 Unspecified osteoarthritis, unspecified site: Secondary | ICD-10-CM | POA: Diagnosis not present

## 2024-05-28 DIAGNOSIS — G4733 Obstructive sleep apnea (adult) (pediatric): Secondary | ICD-10-CM | POA: Diagnosis not present

## 2024-05-28 DIAGNOSIS — K219 Gastro-esophageal reflux disease without esophagitis: Secondary | ICD-10-CM | POA: Diagnosis not present

## 2024-05-28 DIAGNOSIS — G629 Polyneuropathy, unspecified: Secondary | ICD-10-CM | POA: Diagnosis not present

## 2024-05-28 DIAGNOSIS — E039 Hypothyroidism, unspecified: Secondary | ICD-10-CM | POA: Diagnosis not present

## 2024-05-28 DIAGNOSIS — N182 Chronic kidney disease, stage 2 (mild): Secondary | ICD-10-CM | POA: Diagnosis not present

## 2024-05-29 DIAGNOSIS — G4733 Obstructive sleep apnea (adult) (pediatric): Secondary | ICD-10-CM | POA: Diagnosis not present

## 2024-05-29 DIAGNOSIS — M1711 Unilateral primary osteoarthritis, right knee: Secondary | ICD-10-CM | POA: Diagnosis not present

## 2024-06-05 ENCOUNTER — Other Ambulatory Visit: Payer: Self-pay | Admitting: Internal Medicine

## 2024-06-12 DIAGNOSIS — M1711 Unilateral primary osteoarthritis, right knee: Secondary | ICD-10-CM | POA: Diagnosis not present

## 2024-06-13 DIAGNOSIS — G4733 Obstructive sleep apnea (adult) (pediatric): Secondary | ICD-10-CM | POA: Diagnosis not present

## 2024-06-28 DIAGNOSIS — G4733 Obstructive sleep apnea (adult) (pediatric): Secondary | ICD-10-CM | POA: Diagnosis not present

## 2024-07-01 ENCOUNTER — Other Ambulatory Visit: Payer: Self-pay

## 2024-07-01 ENCOUNTER — Telehealth: Payer: Self-pay | Admitting: Internal Medicine

## 2024-07-01 NOTE — Telephone Encounter (Signed)
 Patient came by office for lab work, per lab tech patient needs paper of labs and double check the order he thought it was more than his A1c.

## 2024-07-01 NOTE — Telephone Encounter (Signed)
Taken care of this am.

## 2024-07-02 ENCOUNTER — Telehealth: Payer: Self-pay | Admitting: *Deleted

## 2024-07-02 LAB — HEMOGLOBIN A1C
Est. average glucose Bld gHb Est-mCnc: 126 mg/dL
Hgb A1c MFr Bld: 6 % — ABNORMAL HIGH (ref 4.8–5.6)

## 2024-07-02 NOTE — Telephone Encounter (Signed)
 Fax received from [facility] with download from patient's CPAP machine.  Placed in Tammy's review folder.

## 2024-07-03 ENCOUNTER — Ambulatory Visit

## 2024-07-03 VITALS — BP 120/70 | HR 56 | Ht 72.0 in | Wt 273.0 lb

## 2024-07-03 DIAGNOSIS — E039 Hypothyroidism, unspecified: Secondary | ICD-10-CM | POA: Diagnosis not present

## 2024-07-03 DIAGNOSIS — E66812 Obesity, class 2: Secondary | ICD-10-CM | POA: Diagnosis not present

## 2024-07-03 DIAGNOSIS — I10 Essential (primary) hypertension: Secondary | ICD-10-CM | POA: Diagnosis not present

## 2024-07-03 DIAGNOSIS — Z125 Encounter for screening for malignant neoplasm of prostate: Secondary | ICD-10-CM | POA: Diagnosis not present

## 2024-07-03 DIAGNOSIS — E1140 Type 2 diabetes mellitus with diabetic neuropathy, unspecified: Secondary | ICD-10-CM

## 2024-07-03 DIAGNOSIS — E291 Testicular hypofunction: Secondary | ICD-10-CM | POA: Diagnosis not present

## 2024-07-03 NOTE — Progress Notes (Signed)
 Established Patient Office Visit  Subjective   Patient ID: Keith Solis, male    DOB: 04-Apr-1956  Age: 68 y.o. MRN: [id number]  Chief Complaint  Patient presents with   Medical Management of Chronic Issues    6 month follow up    HPI Discussed the use of AI scribe software for clinical note transcription with the patient, who gave verbal consent to proceed.  History of Present Illness   Keith Solis is a 68 year old male who presents for a six-month follow-up visit.  Sleep disturbance - Chronic difficulty maintaining sleep, with awakening around 4 or 5 AM regardless of bedtime - Takes gabapentin  at night without improvement in sleep maintenance - No excessive worry, but sometimes has difficulty quieting his mind when preoccupied with projects or concerns  Hypertension - Elevated blood pressure readings, attributed to coffee consumption - Takes two antihypertensive medications: one in the morning and one at night - Recognizes elevated blood pressure by onset of headaches  Fatigue and testosterone  therapy - Long-term testosterone  therapy with desire to discontinue - Experiences low energy when not on testosterone  therapy - Actively attempting weight loss, aware of the relationship between weight and testosterone  levels  Medication efficacy concerns - History of accidental kerosene ingestion in childhood - Belief that over-the-counter medications are ineffective for him, requiring medical treatment when ill      Patient Active Problem List   Diagnosis Date Noted   Type 2 diabetes mellitus with diabetic neuropathy, without long-term current use of insulin  (HCC) 07/03/2024   Precordial chest pain 01/10/2023   Chest pain 01/09/2023   Acquired hypothyroidism 01/09/2023   GERD without esophagitis 01/09/2023   Obesity (BMI 30-39.9) 01/09/2023   Leukocytosis 01/09/2023   Hyperglycemia due to type 2 diabetes mellitus (HCC) 07/11/2022   Nasal congestion 07/11/2022    Aortic root dilation 04/28/2022   Essential hypertension 04/28/2022   PVC's (premature ventricular contractions) 04/28/2022   DOE (dyspnea on exertion) 04/15/2022   Dizziness 03/16/2022   Fatigue 03/15/2022   Allergic rhinitis 12/22/2021   OSA (obstructive sleep apnea) 12/22/2021   Erectile dysfunction 08/19/2021   Lumbar spondylosis 08/05/2021   Male hypogonadism 08/05/2021   Obesity, Class II, BMI 35-39.9 08/05/2021   Neuropathy 08/05/2021   Tobacco dependence in remission 08/05/2021      ROS    Objective:     BP 120/70 (BP Location: Left Arm, Patient Position: Sitting, Cuff Size: Normal)   Pulse (!) 56   Ht 6' (1.829 m)   Wt 273 lb (123.8 kg)   SpO2 93%   BMI 37.03 kg/m  BP Readings from Last 3 Encounters:  07/03/24 120/70  05/10/24 126/70  03/26/24 102/82   Wt Readings from Last 3 Encounters:  07/03/24 273 lb (123.8 kg)  05/10/24 276 lb 12.8 oz (125.6 kg)  04/30/24 260 lb (117.9 kg)     Physical Exam Vitals and nursing note reviewed.  Constitutional:      Appearance: Normal appearance.  HENT:     Head: Normocephalic.  Eyes:     Extraocular Movements: Extraocular movements intact.     Pupils: Pupils are equal, round, and reactive to light.  Cardiovascular:     Rate and Rhythm: Normal rate and regular rhythm.  Pulmonary:     Effort: Pulmonary effort is normal.     Breath sounds: Normal breath sounds.  Musculoskeletal:     Cervical back: Normal range of motion and neck supple.  Neurological:     Mental Status:  He is alert and oriented to person, place, and time.  Psychiatric:        Mood and Affect: Mood normal.        Thought Content: Thought content normal.     Diabetic foot exam was performed with the following findings:   No deformities, ulcerations, or other skin breakdown Normal sensation of 10g monofilament Intact posterior tibialis and dorsalis pedis pulses      The ASCVD Risk score (Arnett DK, et al., 2019) failed to calculate for the  following reasons:   The valid total cholesterol range is 130 to 320 mg/dL    Assessment & Plan:   Problem List Items Addressed This Visit       Cardiovascular and Mediastinum   Essential hypertension   Blood pressure elevated, possibly due to recent coffee intake. On two antihypertensive medications.  No medication changes are indicated today.       Relevant Orders   CMP14+EGFR (Completed)     Endocrine   Acquired hypothyroidism   Taking thyroid  medication as prescribed. - Check thyroid  levels.      Relevant Orders   TSH + free T4 (Completed)   Male hypogonadism   On testosterone  replacement therapy. Discussed natural decline of testosterone  with age and impact of weight on levels. Desires to discontinue therapy but experiences low energy without it. - Check testosterone  levels.      Relevant Orders   Lipid Profile (Completed)   CBC with Differential/Platelet (Completed)   Testosterone ,Free and Total (Completed)   Type 2 diabetes mellitus with diabetic neuropathy, without long-term current use of insulin  (HCC) - Primary   A1c level is 6, indicating good glycemic control.      Relevant Orders   CMP14+EGFR (Completed)   Lipid Profile (Completed)     Other   Obesity, Class II, BMI 35-39.9   Discussed weight loss efforts. Weight loss beneficial for overall health and testosterone  levels.      Other Visit Diagnoses       Prostate cancer screening       Relevant Orders   PSA (Completed)       No follow-ups on file.    Leita Longs, FNP

## 2024-07-05 LAB — CBC WITH DIFFERENTIAL/PLATELET
Basophils Absolute: 0.1 x10E3/uL (ref 0.0–0.2)
Basos: 1 %
EOS (ABSOLUTE): 0.4 x10E3/uL (ref 0.0–0.4)
Eos: 4 %
Hematocrit: 50.2 % (ref 37.5–51.0)
Hemoglobin: 16.5 g/dL (ref 13.0–17.7)
Immature Grans (Abs): 0.1 x10E3/uL (ref 0.0–0.1)
Immature Granulocytes: 1 %
Lymphocytes Absolute: 2.1 x10E3/uL (ref 0.7–3.1)
Lymphs: 20 %
MCH: 29.7 pg (ref 26.6–33.0)
MCHC: 32.9 g/dL (ref 31.5–35.7)
MCV: 90 fL (ref 79–97)
Monocytes Absolute: 0.9 x10E3/uL (ref 0.1–0.9)
Monocytes: 9 %
Neutrophils Absolute: 6.7 x10E3/uL (ref 1.4–7.0)
Neutrophils: 65 %
Platelets: 227 x10E3/uL (ref 150–450)
RBC: 5.56 x10E6/uL (ref 4.14–5.80)
RDW: 13.2 % (ref 11.6–15.4)
WBC: 10.3 x10E3/uL (ref 3.4–10.8)

## 2024-07-05 LAB — CMP14+EGFR
ALT: 15 IU/L (ref 0–44)
AST: 14 IU/L (ref 0–40)
Albumin: 4.2 g/dL (ref 3.9–4.9)
Alkaline Phosphatase: 64 IU/L (ref 47–123)
BUN/Creatinine Ratio: 11 (ref 10–24)
BUN: 12 mg/dL (ref 8–27)
Bilirubin Total: 1.1 mg/dL (ref 0.0–1.2)
CO2: 24 mmol/L (ref 20–29)
Calcium: 9.2 mg/dL (ref 8.6–10.2)
Chloride: 102 mmol/L (ref 96–106)
Creatinine, Ser: 1.08 mg/dL (ref 0.76–1.27)
Globulin, Total: 2.3 g/dL (ref 1.5–4.5)
Glucose: 89 mg/dL (ref 70–99)
Potassium: 4 mmol/L (ref 3.5–5.2)
Sodium: 141 mmol/L (ref 134–144)
Total Protein: 6.5 g/dL (ref 6.0–8.5)
eGFR: 75 mL/min/1.73 (ref >59)

## 2024-07-05 LAB — TESTOSTERONE,FREE AND TOTAL
Testosterone, Free: 12.9 pg/mL (ref 6.6–18.1)
Testosterone: 580 ng/dL (ref 264–916)

## 2024-07-05 LAB — LIPID PANEL
Chol/HDL Ratio: 2.2 ratio (ref 0.0–5.0)
Cholesterol, Total: 74 mg/dL — ABNORMAL LOW (ref 100–199)
HDL: 34 mg/dL — ABNORMAL LOW (ref >39)
LDL Chol Calc (NIH): 26 mg/dL (ref 0–99)
Triglycerides: 60 mg/dL (ref 0–149)
VLDL Cholesterol Cal: 14 mg/dL (ref 5–40)

## 2024-07-05 LAB — PSA: Prostate Specific Ag, Serum: 3.4 ng/mL (ref 0.0–4.0)

## 2024-07-05 LAB — TSH+FREE T4
Free T4: 1.48 ng/dL (ref 0.82–1.77)
TSH: 2.91 u[IU]/mL (ref 0.450–4.500)

## 2024-07-08 ENCOUNTER — Ambulatory Visit: Payer: Self-pay

## 2024-07-08 NOTE — Assessment & Plan Note (Signed)
 Discussed weight loss efforts. Weight loss beneficial for overall health and testosterone  levels.

## 2024-07-08 NOTE — Assessment & Plan Note (Signed)
 A1c level is 6, indicating good glycemic control.

## 2024-07-08 NOTE — Assessment & Plan Note (Signed)
 Blood pressure elevated, possibly due to recent coffee intake. On two antihypertensive medications.  No medication changes are indicated today.

## 2024-07-08 NOTE — Assessment & Plan Note (Signed)
 On testosterone  replacement therapy. Discussed natural decline of testosterone  with age and impact of weight on levels. Desires to discontinue therapy but experiences low energy without it. - Check testosterone  levels.

## 2024-07-08 NOTE — Assessment & Plan Note (Signed)
 Taking thyroid  medication as prescribed. - Check thyroid  levels.

## 2024-07-16 ENCOUNTER — Other Ambulatory Visit: Payer: Self-pay

## 2024-07-16 DIAGNOSIS — E291 Testicular hypofunction: Secondary | ICD-10-CM

## 2024-07-16 MED ORDER — LEVOTHYROXINE SODIUM 75 MCG PO TABS: 75.0000 ug | ORAL_TABLET | Freq: Every day | ORAL | 0 refills | Status: AC

## 2024-07-16 MED ORDER — OLMESARTAN MEDOXOMIL 40 MG PO TABS: 40.0000 mg | ORAL_TABLET | Freq: Every day | ORAL | 3 refills | Status: AC

## 2024-07-16 NOTE — Telephone Encounter (Signed)
 Copied from CRM [id number]. Topic: Clinical - Medication Refill >> Jul 16, 2024  2:15 PM Wess RAMAN wrote: Medication: levothyroxine  (SYNTHROID ) 75 MCG tablet  olmesartan  (BENICAR ) 40 MG tablet  testosterone  cypionate (DEPOTESTOSTERONE CYPIONATE) 200 MG/ML injection  Has the patient contacted their pharmacy? Yes (Agent: If no, request that the patient contact the pharmacy for the refill. If patient does not wish to contact the pharmacy document the reason why and proceed with request.) (Agent: If yes, when and what did the pharmacy advise?) Pharmacy call to transfer prescriptions  This is the patient's preferred pharmacy:  [facility] - [place], [place] - [address] [place] [address] Phone: [phone] Fax: [phone]  Is this the correct pharmacy for this prescription? Yes If no, delete pharmacy and type the correct one.   Has the prescription been filled recently? Yes  Is the patient out of the medication? No  Has the patient been seen for an appointment in the last year OR does the patient have an upcoming appointment? Yes  Can we respond through MyChart? No  Agent: Please be advised that Rx refills may take up to 3 business days. We ask that you follow-up with your pharmacy.

## 2024-07-17 MED ORDER — TESTOSTERONE CYPIONATE 200 MG/ML IM SOLN: 200.0000 mg | INTRAMUSCULAR | 0 refills | Status: AC

## 2024-08-10 ENCOUNTER — Other Ambulatory Visit: Payer: Self-pay | Admitting: Internal Medicine

## 2024-08-12 ENCOUNTER — Ambulatory Visit: Admitting: Adult Health

## 2024-08-22 ENCOUNTER — Ambulatory Visit: Admitting: Primary Care

## 2024-08-22 ENCOUNTER — Ambulatory Visit: Admitting: Adult Health

## 2024-08-22 ENCOUNTER — Encounter: Payer: Self-pay | Admitting: Primary Care

## 2024-08-22 VITALS — BP 122/60 | HR 62 | Temp 97.6°F | Ht 72.0 in | Wt 275.4 lb

## 2024-08-22 DIAGNOSIS — G4733 Obstructive sleep apnea (adult) (pediatric): Secondary | ICD-10-CM

## 2024-08-22 DIAGNOSIS — Z87891 Personal history of nicotine dependence: Secondary | ICD-10-CM | POA: Diagnosis not present

## 2024-08-22 NOTE — Progress Notes (Signed)
 @Patient  ID: Keith Solis, male    DOB: 1956-01-13, 68 y.o.   MRN: [id number]  Chief Complaint  Patient presents with   Obstructive Sleep Apnea    Referring provider: Melvenia Manus BRAVO, MD  HPI: 68 yo male seen for sleep consult 06/19/23 for snoring found to have severe OSA  08/22/2024- interim hx  Discussed the use of AI scribe software for clinical note transcription with the patient, who gave verbal consent to proceed.  History of Present Illness Keith Solis is a 68 year old male with severe sleep apnea who presents for follow-up on CPAP therapy.  He was diagnosed with severe sleep apnea following a sleep study in August 2025, which showed 34 apneic events per hour and a lowest oxygen saturation of 63%. He was started on CPAP therapy and has experienced significant improvement in symptoms. Prior to CPAP use, he had daytime sleepiness, snoring, and witnessed apneas as reported by his wife. Since starting CPAP, he no longer snores, and his daytime sleepiness has resolved.  He has a history of smoking, having quit approximately 13 to 14 years ago after smoking for over 20 years at a rate of about 1.5 packs per day, resulting in a 30 pack-year history. He is interested in lung cancer screening due to a family history of cancer on both sides of his family and his sister's recent diagnosis of lung nodules.  No current shortness of breath or hemoptysis.  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      No Known Allergies  Immunization History  Administered Date(s) Administered   Fluad Quad(high Dose 65+) 05/11/2023, 05/07/2024   Influenza,inj,quad, With Preservative 05/06/2016, 06/03/2018   Pneumococcal Polysaccharide-23 01/15/2017   Pneumococcal-Unspecified 09/19/2014   Zoster Recombinant(Shingrix) 06/02/2023    Past Medical History:  Diagnosis Date   Diabetes mellitus (HCC)    Gastritis    GERD  (gastroesophageal reflux disease)    Hiatal hernia    Hypertension    Neuropathy    Obesity    Schatzki's ring     Tobacco History: Social History   Tobacco Use  Smoking Status Former   Current packs/day: 0.00   Average packs/day: 1 pack/day for 24.0 years (24.0 ttl pk-yrs)   Types: Cigarettes   Start date: 57   Quit date: 2013   Years since quitting: 12.9   Passive exposure: Never  Smokeless Tobacco Never   Counseling given: Not Answered   Outpatient Medications Prior to Visit  Medication Sig Dispense Refill   albuterol (VENTOLIN HFA) 108 (90 Base) MCG/ACT inhaler Inhale 2 puffs into the lungs every 6 (six) hours as needed for wheezing or shortness of breath.     amLODipine  (NORVASC ) 5 MG tablet Take 1 tablet (5 mg total) by mouth daily. 90 tablet 1   ammonium lactate  (AMLACTIN DAILY) 12 % lotion Apply 1 Application topically as needed. 400 g 0   gabapentin  (NEURONTIN ) 300 MG capsule Take 1 capsule (300 mg total) by mouth 3 (three) times daily. 90 capsule 1   levothyroxine  (SYNTHROID ) 75 MCG tablet Take 1 tablet (75 mcg total) by mouth daily before breakfast. 90 tablet 0   olmesartan  (BENICAR ) 40 MG tablet Take 1 tablet (40 mg total) by mouth daily. 90 tablet 3   omeprazole  (PRILOSEC) 40 MG capsule Take 1 capsule by mouth twice daily 180 capsule 0   testosterone  cypionate (DEPOTESTOSTERONE CYPIONATE) 200 MG/ML injection Inject 1 mL (200  mg total) into the muscle every 14 (fourteen) days. 6 mL 0   No facility-administered medications prior to visit.   Review of Systems  Review of Systems  Constitutional: Negative.   Respiratory: Negative.     Physical Exam  BP 122/60   Pulse 62   Temp 97.6 F (36.4 C)   Ht 6' (1.829 m) Comment: pt stated  Wt 275 lb 6.4 oz (124.9 kg)   SpO2 96% Comment: ra  BMI 37.35 kg/m  Physical Exam Constitutional:      Appearance: Normal appearance. He is well-developed.  HENT:     Head: Normocephalic and atraumatic.     Mouth/Throat:      Mouth: Mucous membranes are moist.     Pharynx: Oropharynx is clear.  Cardiovascular:     Rate and Rhythm: Normal rate and regular rhythm.     Heart sounds: Normal heart sounds.  Pulmonary:     Effort: Pulmonary effort is normal. No respiratory distress.     Breath sounds: Normal breath sounds. No wheezing or rhonchi.  Musculoskeletal:        General: Normal range of motion.     Cervical back: Normal range of motion and neck supple.  Skin:    General: Skin is warm and dry.     Findings: No erythema or rash.  Neurological:     General: No focal deficit present.     Mental Status: He is alert and oriented to person, place, and time. Mental status is at baseline.  Psychiatric:        Mood and Affect: Mood normal.        Behavior: Behavior normal.        Thought Content: Thought content normal.        Judgment: Judgment normal.      Lab Results:  CBC    Component Value Date/Time   WBC 10.3 07/03/2024 0946   WBC 12.8 (H) 01/10/2023 0248   RBC 5.56 07/03/2024 0946   RBC 5.43 01/10/2023 0248   HGB 16.5 07/03/2024 0946   HCT 50.2 07/03/2024 0946   PLT 227 07/03/2024 0946   MCV 90 07/03/2024 0946   MCH 29.7 07/03/2024 0946   MCH 28.2 01/10/2023 0248   MCHC 32.9 07/03/2024 0946   MCHC 32.1 01/10/2023 0248   RDW 13.2 07/03/2024 0946   LYMPHSABS 2.1 07/03/2024 0946   MONOABS 0.8 04/15/2022 0941   EOSABS 0.4 07/03/2024 0946   BASOSABS 0.1 07/03/2024 0946    BMET    Component Value Date/Time   NA 141 07/03/2024 0946   K 4.0 07/03/2024 0946   CL 102 07/03/2024 0946   CO2 24 07/03/2024 0946   GLUCOSE 89 07/03/2024 0946   GLUCOSE 101 (H) 01/10/2023 0248   BUN 12 07/03/2024 0946   CREATININE 1.08 07/03/2024 0946   CALCIUM 9.2 07/03/2024 0946   GFRNONAA >60 01/10/2023 0248   GFRAA  09/13/2010 2351    >60        The eGFR has been calculated using the MDRD equation. This calculation has not been validated in all clinical situations. eGFR's persistently <60 mL/min  signify possible Chronic Kidney Disease.    BNP No results found for: BNP  ProBNP No results found for: PROBNP  Imaging: No results found.   Assessment & Plan:   1. OSA (obstructive sleep apnea) (Primary)  Assessment and Plan Assessment & Plan Obstructive sleep apnea Severe obstructive sleep apnea diagnosed via sleep study in August 2025, showing 34 apneic events  per hour and lowest oxygen saturation of 63%. Symptoms included snoring, witnessed apnea, and daytime sleepiness. CPAP therapy initiated with significant improvement in symptoms. Current compliance report shows 100% usage with an average of 5 hours and 17 minutes per night. Pressure settings are auto-set between 5 to 20 cm H2O, reducing apneic events to 4.2 per hour. Oxygen saturation during sleep study was not monitored by CPAP machine, but current oxygen saturation is 96%. - Ordered overnight oximetry test to monitor oxygen levels during sleep. - Continue CPAP therapy with current settings.  Former smoker Former smoker with a 20-year history of smoking approximately 1.5 packs per day, equating to a 30 pack-year history. Quit smoking 13-14 years ago. Family history of cancer on both paternal and maternal sides. Interested in lung cancer screening due to family history and personal smoking history. Hx carnosine ingestion at age 79 resulting in prolonged hospital stay and lung scarring.  - Referred to lung cancer screening program for low-dose CT scan. - Coordinated with insurance for coverage of low-dose CT scan.   Almarie LELON Ferrari, NP 08/22/2024

## 2024-08-22 NOTE — Patient Instructions (Signed)
  VISIT SUMMARY: You came in today for a follow-up on your CPAP therapy for severe sleep apnea. Since starting CPAP, you have experienced significant improvement in your symptoms, including the resolution of daytime sleepiness and snoring. We also discussed your history of smoking and your interest in lung cancer screening due to your family history and personal smoking history.  YOUR PLAN: -OBSTRUCTIVE SLEEP APNEA: Obstructive sleep apnea is a condition where your airway becomes blocked during sleep, causing breathing pauses. Your CPAP therapy is working well, reducing your apneic events to 4.2 per hour. We will continue with your current CPAP settings.  -HISTORY OF NICOTINE DEPENDENCE: You have a history of smoking, which increases your risk for lung cancer. Given your family history and personal smoking history, we have referred you to a lung cancer screening program for a low-dose CT scan, which is covered by your insurance.  INSTRUCTIONS: Please continue using your CPAP machine as directed. We have ordered an overnight oximetry test to monitor your oxygen levels during sleep. Additionally, you have been referred to a lung cancer screening program for a low-dose CT scan. Please follow up with the screening program to schedule your scan.  Referral Lung cancer screening program   Follow-up 1 year with Tammy or Beth NP or sooner if need

## 2024-08-30 ENCOUNTER — Telehealth: Payer: Self-pay

## 2024-08-30 DIAGNOSIS — Z87891 Personal history of nicotine dependence: Secondary | ICD-10-CM

## 2024-08-30 DIAGNOSIS — Z122 Encounter for screening for malignant neoplasm of respiratory organs: Secondary | ICD-10-CM

## 2024-08-30 DIAGNOSIS — R911 Solitary pulmonary nodule: Secondary | ICD-10-CM

## 2024-08-30 NOTE — Telephone Encounter (Signed)
 Lung Cancer Screening Narrative/Criteria Questionnaire (Cigarette Smokers Only- No Cigars/Pipes/vapes)   Keith Solis   SDMV:09/04/2024 at 1:30 pm [place]        68 y.o.                         LDCT: 09/10/2024 at 7:30 am AP  Phone: [phone]  Lung Screening Narrative (confirm age 70-77 yrs Medicare / 50-80 yrs Private pay insurance)   Insurance information: [employer]   Referring Provider: Solomon, NP   This screening involves an initial phone call with a team member from our program. It is called a shared decision making visit. The initial meeting is required by  insurance and Medicare to make sure you understand the program. This appointment takes about 15-20 minutes to complete. You will complete the screening scan at your scheduled date/time.  This scan takes about 5-10 minutes to complete. You can eat and drink normally before and after the scan.  Criteria questions for Lung Cancer Screening:   Are you a current or former smoker? Former Age began smoking: 19   If you are a former smoker, what year did you quit smoking? Quit 2012 (within 15 yrs)   To calculate your smoking history, I need an accurate estimate of how many packs of cigarettes you smoked per day and for how many years. (Not just the number of PPD you are now smoking)   Years smoking 36 x Packs per day 1.5 = Pack years 55   (at least 20 pack yrs)   (Make sure they understand that we need to know how much they have smoked in the past, not just the number of PPD they are smoking now)  Do you have a personal history of cancer?  No    Do you have a family history of cancer? Yes  (cancer type and and relative) Father had throat cancer. Brother died of lung cancer. Aunt died of lung cancer. Grandmother died of bladder cancer.   Are you coughing up blood?  No  Have you had unexplained weight loss of 15 lbs or more in the last 6 months? No  It looks like you meet all criteria.  When would be a good time for us   to schedule you for this screening?   Additional information: N/A

## 2024-09-04 ENCOUNTER — Ambulatory Visit

## 2024-09-04 DIAGNOSIS — Z87891 Personal history of nicotine dependence: Secondary | ICD-10-CM

## 2024-09-04 DIAGNOSIS — Z122 Encounter for screening for malignant neoplasm of respiratory organs: Secondary | ICD-10-CM | POA: Diagnosis not present

## 2024-09-04 NOTE — Patient Instructions (Signed)
 Thank you for participating in the [facility] Screening Program. It was our pleasure to meet you today. We will call you with the results of your scan within the next few days. Your scan will be assigned a Lung RADS category score by the physicians reading the scans.  This Lung RADS score determines follow up scanning.  See below for description of categories, and follow up screening recommendations. We will be in touch to schedule your follow up screening annually or based on recommendations of our providers. We will fax a copy of your scan results to your Primary Care Physician, or the physician who referred you to the program, to ensure they have the results. Please call the office if you have any questions or concerns regarding your scanning experience or results.  Our office number is [phone]. Please speak with Karna Curly, RN., Karna Doom RN, or [facility] RN, and Isaiah Dover RN. They are  our Lung Cancer Screening RN.'s If They are unavailable when you call, Please leave a message on the voice mail. We will return your call at our earliest convenience.This voice mail is monitored several times a day.  Remember, if your scan is normal, we will scan you annually as long as you continue to meet the criteria for the program. (Age 67-80, Current smoker or smoker who has quit within the last 15 years). If you are a former smoker, counselling psychologist. We are proud of you! Remain smoke free! Remember you can refer friends or family members through the number above.  We will screen them to make sure they meet criteria for the program. Thank you for helping us  take better care of you by participating in Lung Screening.  For Virtual Smoking Cessation Classes , The American Lung Association Provides  Freedom From Smoking Classes.  Please search their website for dates and times.    Lung RADS Categories:  Lung RADS 1: no nodules or definitely non-concerning nodules.   Recommendation is for a repeat annual scan in 12 months.  Lung RADS 2:  nodules that are non-concerning in appearance and behavior with a very low likelihood of becoming an active cancer. Recommendation is for a repeat annual scan in 12 months.  Lung RADS 3: nodules that are probably non-concerning , includes nodules with a low likelihood of becoming an active cancer.  Recommendation is for a 74-month repeat screening scan. Often noted after an upper respiratory illness. We will be in touch to make sure you have no questions, and to schedule your 65-month scan.  Lung RADS 4 A: nodules with concerning findings, recommendation is most often for a follow up scan in 3 months or additional testing based on our provider's assessment of the scan. We will be in touch to make sure you have no questions and to schedule the recommended 3 month follow up scan.  Lung RADS 4 B:  indicates findings that are concerning. We will be in touch with you to schedule additional diagnostic testing based on our provider's  assessment of the scan.  You can receive free nicotine replacement therapy ( patches, gum or mints) by calling 1-800-QUIT NOW. Please call so we can get you on the path to becoming  a non-smoker. I know it is hard, but you can do this!  Other options for assistance in smoking cessation ( As covered by your insurance benefits)  Hypnosis for smoking cessation  [employer] [phone]  Acupuncture for smoking cessation  [employer] [phone]  Your CT scan is scheduled for 09/10/2024 at 7:30 am at [facility].

## 2024-09-04 NOTE — Progress Notes (Signed)
 Virtual Visit via Telephone Note  I connected with Keith Solis on 09/04/2024 at  1:30 PM EST by telephone and verified that I am speaking with the correct person using two identifiers.  Location: Patient: At home, in [place]  Provider: [address] [place], [place], [address]    I discussed the limitations, risks, security and privacy concerns of performing an evaluation and management service by telephone and the availability of in person appointments. I also discussed with the patient that there may be a patient responsible charge related to this service. The patient expressed understanding and agreed to proceed.  Shared Decision Making Visit Lung Cancer Screening Program [phone])   Eligibility: Age 14 y.o. Pack Years Smoking History Calculation 54 pack years (# packs/per year x # years smoked) Recent History of coughing up blood  no Unexplained weight loss? no ( >Than 15 pounds within the last 6 months ) Prior History Lung / other cancer no (Diagnosis within the last 5 years already requiring surveillance chest CT Scans). Smoking Status Former Smoker Former Smokers: Years since quit: 13 years  Quit Date: 2012  Visit Components: Discussion included one or more decision making aids. yes Discussion included risk/benefits of screening. yes Discussion included potential follow up diagnostic testing for abnormal scans. yes Discussion included meaning and risk of over diagnosis. yes Discussion included meaning and risk of False Positives. yes Discussion included meaning of total radiation exposure. yes  Counseling Included: Importance of adherence to annual lung cancer LDCT screening. yes Impact of comorbidities on ability to participate in the program. yes Ability and willingness to under diagnostic treatment. yes  Smoking Cessation Counseling: Current Smokers:  Discussed importance of smoking cessation. N/A Information about tobacco cessation classes and interventions  provided to patient. N/A Patient provided with ticket for LDCT Scan. N/A Symptomatic Patient. N/A  Counseling Diagnosis Code: Tobacco Use Z72.0 Asymptomatic Patient N/A  Counseling  Former Smokers:  Discussed the importance of maintaining cigarette abstinence. yes Diagnosis Code: Personal History of Nicotine Dependence. S12.108 Information about tobacco cessation classes and interventions provided to patient. Yes Patient provided with ticket for LDCT Scan. N/A Written Order for Lung Cancer Screening with LDCT placed in Epic. Yes (CT Chest Lung Cancer Screening Low Dose W/O CM) [id number] Z12.2-Screening of respiratory organs Z87.891-Personal history of nicotine dependence   Shared decision visit completed by Keith Georgia, FNP as a registered nurse awaiting credentialing.    Keith CHRISTELLA Georgia, FNP

## 2024-09-10 ENCOUNTER — Ambulatory Visit (HOSPITAL_COMMUNITY)
Admission: RE | Admit: 2024-09-10 | Discharge: 2024-09-10 | Disposition: A | Discharge status: Home / Self Care | Source: Ambulatory Visit | Attending: Acute Care | Admitting: Acute Care

## 2024-09-10 DIAGNOSIS — Z87891 Personal history of nicotine dependence: Secondary | ICD-10-CM | POA: Diagnosis present

## 2024-09-10 DIAGNOSIS — Z122 Encounter for screening for malignant neoplasm of respiratory organs: Secondary | ICD-10-CM | POA: Diagnosis present

## 2024-09-10 DIAGNOSIS — R911 Solitary pulmonary nodule: Secondary | ICD-10-CM | POA: Insufficient documentation

## 2024-09-20 ENCOUNTER — Other Ambulatory Visit: Payer: Self-pay

## 2024-09-20 DIAGNOSIS — Z122 Encounter for screening for malignant neoplasm of respiratory organs: Secondary | ICD-10-CM

## 2024-09-20 DIAGNOSIS — Z87891 Personal history of nicotine dependence: Secondary | ICD-10-CM

## 2024-09-23 ENCOUNTER — Other Ambulatory Visit: Payer: Self-pay

## 2024-09-23 DIAGNOSIS — G629 Polyneuropathy, unspecified: Secondary | ICD-10-CM

## 2024-09-27 ENCOUNTER — Telehealth: Payer: Self-pay

## 2024-09-27 NOTE — Telephone Encounter (Signed)
 Copied from CRM #[id number]. Topic: Clinical - Lab/Test Results >> Sep 27, 2024  9:03 AM Ismael A wrote: Reason for CRM: patient is calling to go over results from lung cancer screening CT scan - please call back  Routing to [facility] to advise of results.

## 2024-09-27 NOTE — Telephone Encounter (Signed)
 Spoke with patient and reviewed lung screening CT results. Patient verbalized understanding and had no further questions.

## 2025-05-05 ENCOUNTER — Ambulatory Visit
